# Patient Record
Sex: Female | Born: 1995 | Hispanic: No | Marital: Married | Smoking: Never smoker
Health system: Southern US, Community
[De-identification: ages and names within clinical notes are randomized; demographics above are authoritative.]

## PROBLEM LIST (undated history)

## (undated) ENCOUNTER — Inpatient Hospital Stay (HOSPITAL_COMMUNITY): Payer: Self-pay

## (undated) ENCOUNTER — Emergency Department (HOSPITAL_COMMUNITY): Discharge status: Absent without leave | Payer: Medicaid Other

## (undated) DIAGNOSIS — N83209 Unspecified ovarian cyst, unspecified side: Secondary | ICD-10-CM

## (undated) DIAGNOSIS — D649 Anemia, unspecified: Secondary | ICD-10-CM

---

## 2015-10-28 ENCOUNTER — Encounter: Payer: Self-pay | Admitting: *Deleted

## 2015-11-06 ENCOUNTER — Inpatient Hospital Stay (HOSPITAL_COMMUNITY)
Admission: AD | Admit: 2015-11-06 | Discharge: 2015-11-06 | Disposition: A | Discharge status: Home / Self Care | Payer: Medicaid Other | Source: Ambulatory Visit | Attending: Obstetrics and Gynecology | Admitting: Obstetrics and Gynecology

## 2015-11-06 ENCOUNTER — Encounter (HOSPITAL_COMMUNITY): Payer: Self-pay | Admitting: *Deleted

## 2015-11-06 DIAGNOSIS — Z3A33 33 weeks gestation of pregnancy: Secondary | ICD-10-CM | POA: Diagnosis not present

## 2015-11-06 DIAGNOSIS — O0933 Supervision of pregnancy with insufficient antenatal care, third trimester: Secondary | ICD-10-CM | POA: Insufficient documentation

## 2015-11-06 DIAGNOSIS — O212 Late vomiting of pregnancy: Secondary | ICD-10-CM | POA: Diagnosis not present

## 2015-11-06 DIAGNOSIS — O219 Vomiting of pregnancy, unspecified: Secondary | ICD-10-CM

## 2015-11-06 DIAGNOSIS — E860 Dehydration: Secondary | ICD-10-CM | POA: Diagnosis not present

## 2015-11-06 DIAGNOSIS — O26899 Other specified pregnancy related conditions, unspecified trimester: Secondary | ICD-10-CM

## 2015-11-06 DIAGNOSIS — R109 Unspecified abdominal pain: Secondary | ICD-10-CM | POA: Insufficient documentation

## 2015-11-06 DIAGNOSIS — O9989 Other specified diseases and conditions complicating pregnancy, childbirth and the puerperium: Secondary | ICD-10-CM | POA: Diagnosis not present

## 2015-11-06 HISTORY — DX: Unspecified ovarian cyst, unspecified side: N83.209

## 2015-11-06 HISTORY — DX: Anemia, unspecified: D64.9

## 2015-11-06 LAB — CBC
HCT: 31.1 % — ABNORMAL LOW (ref 36.0–46.0)
Hemoglobin: 10.8 g/dL — ABNORMAL LOW (ref 12.0–15.0)
MCH: 32.2 pg (ref 26.0–34.0)
MCHC: 34.7 g/dL (ref 30.0–36.0)
MCV: 92.8 fL (ref 78.0–100.0)
PLATELETS: 212 10*3/uL (ref 150–400)
RBC: 3.35 MIL/uL — AB (ref 3.87–5.11)
RDW: 13.1 % (ref 11.5–15.5)
WBC: 7.3 10*3/uL (ref 4.0–10.5)

## 2015-11-06 LAB — URINALYSIS, ROUTINE W REFLEX MICROSCOPIC
BILIRUBIN URINE: NEGATIVE
Glucose, UA: NEGATIVE mg/dL
Hgb urine dipstick: NEGATIVE
Ketones, ur: 15 mg/dL — AB
NITRITE: NEGATIVE
PH: 6 (ref 5.0–8.0)
Protein, ur: NEGATIVE mg/dL
SPECIFIC GRAVITY, URINE: 1.025 (ref 1.005–1.030)

## 2015-11-06 LAB — URINE MICROSCOPIC-ADD ON: RBC / HPF: NONE SEEN RBC/hpf (ref 0–5)

## 2015-11-06 MED ORDER — PROMETHAZINE HCL 25 MG PO TABS
25.0000 mg | ORAL_TABLET | Freq: Once | ORAL | Status: AC
  Administered 2015-11-06: 25 mg via ORAL
  Filled 2015-11-06: qty 1

## 2015-11-06 MED ORDER — PRENATAL VITAMINS 28-0.8 MG PO TABS: 1.0000 | ORAL_TABLET | Freq: Every day | ORAL | Status: DC

## 2015-11-06 MED ORDER — PROMETHAZINE HCL 25 MG PO TABS: 12.5000 mg | ORAL_TABLET | Freq: Four times a day (QID) | ORAL | Status: DC | PRN

## 2015-11-06 NOTE — Progress Notes (Signed)
Patient tolerating PO fluids. Drank full cup of water and full cup of apple juice. No adverse effects to med.

## 2015-11-06 NOTE — MAU Provider Note (Signed)
Chief Complaint:  Dizziness; Abdominal Pain; and Emesis   None     HPI: Claudia Lucas is a 19 y.o. G1P0 at [redacted]w[redacted]d who presents to maternity admissions reporting dizziness daily, intermittent abdominal cramping, mostly at night, and nausea/lack of appetite with occasional vomiting. She arrived in the [place] in  November and has not yet had prenatal care. She has an appointment on 12/15 in [facility] but is worried that her symptoms mean something is wrong with the baby. She reports good fetal movement, denies LOF, vaginal bleeding, vaginal itching/burning, urinary symptoms, h/a, dizziness, n/v, or fever/chills.    Dizziness This is a recurrent problem. The current episode started more than 1 month ago. The problem occurs constantly. The problem has been unchanged. Associated symptoms include abdominal pain, nausea, vomiting and weakness. Pertinent negatives include no chest pain, chills, fatigue, fever, headaches, neck pain or urinary symptoms. The symptoms are aggravated by standing and walking. Treatments tried: iron supplements. The treatment provided moderate relief.  Abdominal Pain This is a recurrent problem. The current episode started in the past 7 days. The onset quality is gradual. The problem occurs intermittently. The problem has been resolved. The pain is located in the generalized abdominal region. The pain is moderate. The quality of the pain is cramping. The abdominal pain does not radiate. Associated symptoms include nausea and vomiting. Pertinent negatives include no constipation, diarrhea, dysuria, fever, frequency, headaches or weight loss. Nothing aggravates the pain. The pain is relieved by nothing. Treatments tried: rest, warm shower. The treatment provided no relief.  Emesis  This is a recurrent problem. The current episode started 1 to 4 weeks ago. The problem occurs less than 2 times per day. The problem has been waxing and waning. The emesis has an appearance of stomach contents. There  has been no fever. Associated symptoms include abdominal pain and dizziness. Pertinent negatives include no chest pain, chills, diarrhea, fever, headaches or weight loss. She has tried nothing for the symptoms.    Past Medical History: Past Medical History  Diagnosis Date  . Anemia   . Ovarian cyst     Past obstetric history: OB History  Gravida Para Term Preterm AB SAB TAB Ectopic Multiple Living  1             # Outcome Date GA Lbr Len/2nd Weight Sex Delivery Anes PTL Lv  1 Current               Past Surgical History: Past Surgical History  Procedure Laterality Date  . No past surgeries      Family History: Family History  Problem Relation Age of Onset  . Diabetes Mother   . Hypertension Mother   . Hypertension Maternal Aunt   . Diabetes Maternal Aunt   . Hypertension Maternal Uncle   . Diabetes Maternal Uncle   . Hypertension Maternal Grandmother   . Diabetes Maternal Grandmother   . Hypertension Maternal Grandfather   . Diabetes Maternal Grandfather   . Cancer Neg Hx   . Heart disease Neg Hx   . Stroke Neg Hx     Social History: Social History  Substance Use Topics  . Smoking status: Never Smoker   . Smokeless tobacco: Never Used  . Alcohol Use: No    Allergies: No Known Allergies  Meds:  No prescriptions prior to admission    ROS:  Review of Systems  Constitutional: Negative for fever, chills, weight loss and fatigue.  HENT: Negative for sinus pressure.   Eyes: Negative  for photophobia.  Respiratory: Negative for shortness of breath.   Cardiovascular: Negative for chest pain.  Gastrointestinal: Positive for nausea, vomiting and abdominal pain. Negative for diarrhea and constipation.  Genitourinary: Negative for dysuria, frequency, flank pain, vaginal bleeding, vaginal discharge, difficulty urinating, vaginal pain and pelvic pain.  Musculoskeletal: Negative for neck pain.  Neurological: Positive for dizziness and weakness. Negative for  headaches.  Psychiatric/Behavioral: Negative.      I have reviewed patient's Past Medical Hx, Surgical Hx, Family Hx, Social Hx, medications and allergies.   Physical Exam  Patient Vitals for the past 24 hrs:  BP Temp Temp src Pulse Resp SpO2  11/06/15 1327 - - - 116 - 100 %  11/06/15 1318 118/62 mmHg 98.9 F (37.2 C) Oral 106 16 -   Constitutional: Well-developed, well-nourished female in no acute distress.  Cardiovascular: normal rate Respiratory: normal effort GI: Abd soft, non-tender, gravid appropriate for gestational age.  MS: Extremities nontender, no edema, normal ROM Neurologic: Alert and oriented x 4.  GU: Neg CVAT.   Dilation: Closed Effacement (%): Thick Cervical Position: Posterior Station: Ballotable Exam by:: Clayton LefortL. Kirby, CNM  FHT:  Baseline 150 , moderate variability, accelerations present, no decelerations Contractions: None on toco or to palpation   Labs: Results for orders placed or performed during the hospital encounter of 11/06/15 (from the past 24 hour(s))  Urinalysis, Routine w reflex microscopic (not [facility])     Status: Abnormal   Collection Time: 11/06/15  1:45 PM  Result Value Ref Range   Color, Urine YELLOW YELLOW   APPearance HAZY (A) CLEAR   Specific Gravity, Urine 1.025 1.005 - 1.030   pH 6.0 5.0 - 8.0   Glucose, UA NEGATIVE NEGATIVE mg/dL   Hgb urine dipstick NEGATIVE NEGATIVE   Bilirubin Urine NEGATIVE NEGATIVE   Ketones, ur 15 (A) NEGATIVE mg/dL   Protein, ur NEGATIVE NEGATIVE mg/dL   Nitrite NEGATIVE NEGATIVE   Leukocytes, UA MODERATE (A) NEGATIVE  Urine microscopic-add on     Status: Abnormal   Collection Time: 11/06/15  1:45 PM  Result Value Ref Range   Squamous Epithelial / LPF 0-5 (A) NONE SEEN   WBC, UA 0-5 0 - 5 WBC/hpf   RBC / HPF NONE SEEN 0 - 5 RBC/hpf   Bacteria, UA RARE (A) NONE SEEN      Imaging:  No results found.  MAU Course/MDM: I have ordered labs and reviewed results. No evidence of preterm labor or other  pregnancy complications today. Will treat n/v with Phenergan, and encourage PNV for iron intake.  No additional supplement needed based on today's labs but pt encouraged to eat iron rich diet.  Treatments in MAU included Phenergan 25 mg PO with improvement in nausea.  Outpatient US ordered and pt to continue appt as planned with [facility].  Pt stable at time of discharge.  Assessment:  1. Mild dehydration   2. Nausea and vomiting during pregnancy   3. Abdominal pain affecting pregnancy   4. Limited prenatal care in third trimester    Plan: Discharge home Labor precautions and fetal kick counts F/U with outpatient US Phenergan 25 mg PO Q 6 hours PRN Prenatal vitamin daily F/U with [facility] Return to MAU as needed for emergencies    Medication List    Notice    You have not been prescribed any medications.      Sharen CounterLisa Leftwich-Kirby Certified Nurse-Midwife 11/06/2015 2:35 PM

## 2015-11-06 NOTE — MAU Note (Addendum)
Is experiencing dizziness.  Having abd pain for the last 15 days.  Is vomiting and not eating. Not sleeping the entire night.  Extreme tiredness and headache. Is 8 months preg, thinks that something is wrong with the baby.  No prenatal care since arriving in the [place]. Has appt for next Tues..  Arrived Nov 1st. Has hx of anemia.

## 2015-11-06 NOTE — Discharge Instructions (Signed)
()   .          .                 Marland Kitchen                .                  ( ).      .                  .        :      .      .       :  . :     (       )          .       :   B6.   .   .                     .                 .                .        .     (  ).           .      .    .      Marland Kitchen                 .                 .      (  )     .     .     (  )   .     .   .           .      .    :         .     .   Marland Kitchen SEEK    :      .    ().  :   .   .             .                 .            .    : 01/07/2007  : 2013/11/21   : 2013/03/05      2016    Morning Sickness Morning sickness is when you feel sick to your stomach (nauseous) during pregnancy. This nauseous feeling may or may not come with vomiting. It often occurs in the morning but can be a problem any time of day. Morning sickness is most common during the first trimester, but it may continue throughout pregnancy. While morning sickness is unpleasant, it is usually harmless unless you develop severe and continual vomiting (hyperemesis gravidarum). This condition requires more intense treatment.  CAUSES  The cause of morning sickness is not completely known but seems to be related to normal hormonal changes that occur in pregnancy. RISK FACTORS You are at greater risk if you:  Experienced nausea or vomiting before your pregnancy.  Had morning sickness during a previous pregnancy.  Are pregnant with more than one baby, such as twins. TREATMENT  Do not use any medicines (prescription, over-the-counter, or herbal) for morning sickness without first talking to your health care provider. Your health care provider may prescribe or recommend:  Vitamin B6 supplements.  Anti-nausea medicines.  The herbal medicine ginger. HOME CARE INSTRUCTIONS   Only take  over-the-counter or prescription medicines as directed by your health care provider.  Taking multivitamins before getting pregnant can prevent or decrease the severity of morning sickness in most women.  Eat a piece of dry toast or unsalted crackers before getting out of bed in the morning.  Eat five or six small meals a day.  Eat dry and bland foods (rice, baked potato). Foods high in carbohydrates are often helpful.  Do not drink liquids with your meals. Drink liquids between meals.  Avoid greasy, fatty, and spicy foods.  Get someone to cook for you if the smell of any food causes nausea and vomiting.  If you feel nauseous after taking prenatal vitamins, take the vitamins at night or with a snack.  Snack on protein foods (nuts, yogurt, cheese) between meals if you are hungry.  Eat unsweetened gelatins for desserts.  Wearing an acupressure wristband (worn for sea sickness) may be helpful.  Acupuncture may be helpful.  Do not smoke.  Get a humidifier to keep the air in your house free of odors.  Get plenty of fresh air. SEEK MEDICAL CARE IF:   Your home remedies are not working, and you need medicine.  You feel dizzy or lightheaded.  You are losing weight. SEEK IMMEDIATE MEDICAL CARE IF:   You have persistent and uncontrolled nausea and vomiting.  You pass out (faint). MAKE SURE YOU:  Understand these instructions.  Will watch your condition.  Will get help right away if you are not doing well or get worse.   This information is not intended to replace advice given to you by your health care provider. Make sure you discuss any questions you have with your health care provider.   Document Released: 01/07/2007 Document Revised: 11/21/2013 Document Reviewed: 05/03/2013 Elsevier Interactive Patient Education [employer]2016 Elsevier Inc.

## 2015-11-13 ENCOUNTER — Ambulatory Visit (HOSPITAL_COMMUNITY)
Admit: 2015-11-13 | Discharge: 2015-11-13 | Disposition: A | Discharge status: Home / Self Care | Payer: Medicaid Other | Attending: Advanced Practice Midwife | Admitting: Advanced Practice Midwife

## 2015-11-13 ENCOUNTER — Other Ambulatory Visit (HOSPITAL_COMMUNITY): Payer: Self-pay | Admitting: Advanced Practice Midwife

## 2015-11-13 DIAGNOSIS — O0933 Supervision of pregnancy with insufficient antenatal care, third trimester: Secondary | ICD-10-CM

## 2015-11-13 DIAGNOSIS — Z3A34 34 weeks gestation of pregnancy: Secondary | ICD-10-CM | POA: Diagnosis not present

## 2015-11-13 DIAGNOSIS — Z3689 Encounter for other specified antenatal screening: Secondary | ICD-10-CM

## 2015-11-13 DIAGNOSIS — Z36 Encounter for antenatal screening of mother: Secondary | ICD-10-CM | POA: Insufficient documentation

## 2015-11-14 ENCOUNTER — Encounter: Payer: Self-pay | Admitting: Obstetrics & Gynecology

## 2015-11-14 ENCOUNTER — Other Ambulatory Visit (HOSPITAL_COMMUNITY)
Admission: RE | Admit: 2015-11-14 | Discharge: 2015-11-14 | Disposition: A | Discharge status: Home / Self Care | Payer: Medicaid Other | Source: Ambulatory Visit | Attending: Obstetrics & Gynecology | Admitting: Obstetrics & Gynecology

## 2015-11-14 ENCOUNTER — Ambulatory Visit (INDEPENDENT_AMBULATORY_CARE_PROVIDER_SITE_OTHER): Payer: Medicaid Other | Admitting: Obstetrics & Gynecology

## 2015-11-14 VITALS — BP 118/73 | HR 111 | Temp 97.8°F | Wt 144.7 lb

## 2015-11-14 DIAGNOSIS — Z3400 Encounter for supervision of normal first pregnancy, unspecified trimester: Secondary | ICD-10-CM | POA: Insufficient documentation

## 2015-11-14 DIAGNOSIS — Z3403 Encounter for supervision of normal first pregnancy, third trimester: Secondary | ICD-10-CM | POA: Diagnosis not present

## 2015-11-14 DIAGNOSIS — Z23 Encounter for immunization: Secondary | ICD-10-CM | POA: Diagnosis not present

## 2015-11-14 DIAGNOSIS — Z01419 Encounter for gynecological examination (general) (routine) without abnormal findings: Secondary | ICD-10-CM | POA: Insufficient documentation

## 2015-11-14 LAB — POCT URINALYSIS DIP (DEVICE)
BILIRUBIN URINE: NEGATIVE
GLUCOSE, UA: NEGATIVE mg/dL
Hgb urine dipstick: NEGATIVE
KETONES UR: NEGATIVE mg/dL
Nitrite: NEGATIVE
Protein, ur: NEGATIVE mg/dL
SPECIFIC GRAVITY, URINE: 1.015 (ref 1.005–1.030)
Urobilinogen, UA: 0.2 mg/dL (ref 0.0–1.0)
pH: 7 (ref 5.0–8.0)

## 2015-11-14 LAB — GLUCOSE TOLERANCE, 1 HOUR (50G) W/O FASTING: Glucose, 1 Hour GTT: 144 mg/dL — ABNORMAL HIGH (ref 70–140)

## 2015-11-14 MED ORDER — TETANUS-DIPHTH-ACELL PERTUSSIS 5-2.5-18.5 LF-MCG/0.5 IM SUSP
0.5000 mL | Freq: Once | INTRAMUSCULAR | Status: AC
  Administered 2015-11-14: 0.5 mL via INTRAMUSCULAR

## 2015-11-14 NOTE — Progress Notes (Signed)
Initial prenatal labs, 1hr gtt today Flu/Tdap today Breastfeeding tip of the week reviewed Arabic interpreter present

## 2015-11-14 NOTE — Patient Instructions (Signed)

## 2015-11-14 NOTE — Progress Notes (Signed)
   Subjective: new OB had care in [place]    Claudia Ok Edwardsbdo is a G1P0 8089w2d being seen today for her first obstetrical visit.  Her obstetrical history is significant for refugee from [place] laate registrant here. Patient does intend to breast feed. Pregnancy history fully reviewed.  Patient reports no complaints.  Filed Vitals:   11/14/15 0923  BP: 118/73  Pulse: 111  Temp: 97.8 F (36.6 C)  Weight: 144 lb 11.2 oz (65.635 kg)    HISTORY: OB History  Gravida Para Term Preterm AB SAB TAB Ectopic Multiple Living  1             # Outcome Date GA Lbr Len/2nd Weight Sex Delivery Anes PTL Lv  1 Current              Past Medical History  Diagnosis Date  . Anemia   . Ovarian cyst    Past Surgical History  Procedure Laterality Date  . No past surgeries     Family History  Problem Relation Age of Onset  . Diabetes Mother   . Hypertension Mother   . Hypertension Maternal Aunt   . Diabetes Maternal Aunt   . Hypertension Maternal Uncle   . Diabetes Maternal Uncle   . Hypertension Maternal Grandmother   . Diabetes Maternal Grandmother   . Hypertension Maternal Grandfather   . Diabetes Maternal Grandfather   . Cancer Neg Hx   . Heart disease Neg Hx   . Stroke Neg Hx      Exam    Uterus:     Pelvic Exam:    Perineum: No Hemorrhoids   Vulva: normal   Vagina:  normal mucosa   pH:    Cervix: no lesions   Adnexa: not evaluated   Bony Pelvis:    System: Breast:      Skin: normal coloration and turgor, no rashes    Neurologic: oriented, normal mood   Extremities: normal strength, tone, and muscle mass   HEENT extra ocular movement intact   Mouth/Teeth dental hygiene good   Neck supple   Cardiovascular: regular rate and rhythm   Respiratory:  appears well, vitals normal, no respiratory distress, acyanotic, normal RR   Abdomen: soft, non-tender; bowel sounds normal; no masses,  no organomegaly   Urinary: urethral meatus normal      Assessment:    Pregnancy: G1P0 Patient Active Problem List   Diagnosis Date Noted  . Supervision of low-risk first pregnancy 11/14/2015        Plan:     Initial labs drawn. Prenatal vitamins. Problem list reviewed and updated. Genetic Screening discussed no records but had nl US in [place]  Ultrasound discussed; fetal survey: results reviewed.11/13/15  Follow up in 2 weeks. 50% of 30 min visit spent on counseling and coordination of care.  Repeat prenatal labs and 1 hr GTT   ARNOLD,JAMES 11/14/2015

## 2015-11-15 LAB — PRENATAL PROFILE (SOLSTAS)
Antibody Screen: NEGATIVE
Basophils Absolute: 0 10*3/uL (ref 0.0–0.1)
Basophils Relative: 0 % (ref 0–1)
EOS PCT: 4 % (ref 0–5)
Eosinophils Absolute: 0.2 10*3/uL (ref 0.0–0.7)
HCT: 32.3 % — ABNORMAL LOW (ref 36.0–46.0)
HIV: NONREACTIVE
Hemoglobin: 11.1 g/dL — ABNORMAL LOW (ref 12.0–15.0)
Hepatitis B Surface Ag: NEGATIVE
LYMPHS ABS: 1.1 10*3/uL (ref 0.7–4.0)
Lymphocytes Relative: 18 % (ref 12–46)
MCH: 31.9 pg (ref 26.0–34.0)
MCHC: 34.4 g/dL (ref 30.0–36.0)
MCV: 92.8 fL (ref 78.0–100.0)
MONO ABS: 0.4 10*3/uL (ref 0.1–1.0)
MONOS PCT: 6 % (ref 3–12)
MPV: 11.3 fL (ref 8.6–12.4)
NEUTROS ABS: 4.3 10*3/uL (ref 1.7–7.7)
NEUTROS PCT: 72 % (ref 43–77)
PLATELETS: 238 10*3/uL (ref 150–400)
RBC: 3.48 MIL/uL — ABNORMAL LOW (ref 3.87–5.11)
RDW: 14 % (ref 11.5–15.5)
RH TYPE: POSITIVE
Rubella: 12.2 Index — ABNORMAL HIGH (ref <0.90)
WBC: 6 10*3/uL (ref 4.0–10.5)

## 2015-11-15 LAB — CYTOLOGY - PAP

## 2015-11-16 LAB — PRESCRIPTION MONITORING PROFILE (19 PANEL)
Amphetamine/Meth: NEGATIVE ng/mL
BARBITURATE SCREEN, URINE: NEGATIVE ng/mL
BENZODIAZEPINE SCREEN, URINE: NEGATIVE ng/mL
BUPRENORPHINE, URINE: NEGATIVE ng/mL
COCAINE METABOLITES: NEGATIVE ng/mL
CREATININE, URINE: 68.96 mg/dL (ref >20.0)
Cannabinoid Scrn, Ur: NEGATIVE ng/mL
Carisoprodol, Urine: NEGATIVE ng/mL
ECSTASY: NEGATIVE ng/mL
FENTANYL URINE: NEGATIVE ng/mL
METHAQUALONE SCREEN (URINE): NEGATIVE ng/mL
Meperidine, Ur: NEGATIVE ng/mL
Methadone Screen, Urine: NEGATIVE ng/mL
Nitrites, Initial: NEGATIVE ug/mL
OPIATE SCREEN, URINE: NEGATIVE ng/mL
Oxycodone Screen, Ur: NEGATIVE ng/mL
PH URINE, INITIAL: 7.1 pH (ref 4.5–8.9)
PHENCYCLIDINE, UR: NEGATIVE ng/mL
Propoxyphene: NEGATIVE ng/mL
Tapentadol, urine: NEGATIVE ng/mL
Tramadol Scrn, Ur: NEGATIVE ng/mL
ZOLPIDEM, URINE: NEGATIVE ng/mL

## 2015-11-17 LAB — CULTURE, OB URINE

## 2015-11-18 LAB — HEMOGLOBINOPATHY EVALUATION
Hemoglobin Other: 0 %
Hgb A2 Quant: 2.9 % (ref 2.2–3.2)
Hgb A: 97.1 % (ref 96.8–97.8)
Hgb F Quant: 0 % (ref 0.0–2.0)
Hgb S Quant: 0 %

## 2015-11-19 ENCOUNTER — Telehealth: Payer: Self-pay | Admitting: *Deleted

## 2015-11-19 NOTE — Telephone Encounter (Addendum)
Called Patient with [employer] Interpreter#[id number].  Informed her 1 hr glucose test was high, needs 3 hour GTT. Explained to her needs to come fasting and will be here about 3.5 hours.  She voiced understanding and agreed to 8am tomorrow.

## 2015-11-19 NOTE — Telephone Encounter (Signed)
Per message from Dr. Debroah LoopArnold needs 3 hour GTT.

## 2015-11-20 ENCOUNTER — Other Ambulatory Visit: Payer: Medicaid Other

## 2015-11-20 DIAGNOSIS — R7309 Other abnormal glucose: Secondary | ICD-10-CM

## 2015-11-21 LAB — GLUCOSE TOLERANCE, 3 HOURS
GLUCOSE, 1 HOUR-GESTATIONAL: 124 mg/dL (ref 70–189)
Glucose Tolerance, 2 hour: 96 mg/dL (ref 70–164)
Glucose Tolerance, Fasting: 74 mg/dL (ref 65–99)
Glucose, GTT - 3 Hour: 107 mg/dL (ref 70–144)

## 2015-11-28 ENCOUNTER — Encounter: Payer: Self-pay | Admitting: Family Medicine

## 2015-11-28 ENCOUNTER — Other Ambulatory Visit (HOSPITAL_COMMUNITY)
Admission: RE | Admit: 2015-11-28 | Discharge: 2015-11-28 | Disposition: A | Discharge status: Home / Self Care | Payer: Medicaid Other | Source: Ambulatory Visit | Attending: Family Medicine | Admitting: Family Medicine

## 2015-11-28 ENCOUNTER — Ambulatory Visit (INDEPENDENT_AMBULATORY_CARE_PROVIDER_SITE_OTHER): Payer: Medicaid Other | Admitting: Family Medicine

## 2015-11-28 VITALS — BP 121/78 | HR 113 | Temp 98.1°F | Wt 148.1 lb

## 2015-11-28 DIAGNOSIS — Z3403 Encounter for supervision of normal first pregnancy, third trimester: Secondary | ICD-10-CM

## 2015-11-28 DIAGNOSIS — Z113 Encounter for screening for infections with a predominantly sexual mode of transmission: Secondary | ICD-10-CM | POA: Insufficient documentation

## 2015-11-28 LAB — OB RESULTS CONSOLE GBS: STREP GROUP B AG: NEGATIVE

## 2015-11-28 NOTE — Progress Notes (Signed)
Arabic interpreter: Kenard GowerNuha Mohammad used Subjective:  Claudia Ok Edwardsbdo is a 19 y.o. G1P0 at 4771w2d being seen today for ongoing prenatal care.  She is currently monitored for the following issues for this low-risk pregnancy and has Supervision of low-risk first pregnancy on her problem list.  Patient reports no complaints.  Contractions: Irregular. Vag. Bleeding: None.  Movement: Present. Denies leaking of fluid.   The following portions of the patient's history were reviewed and updated as appropriate: allergies, current medications, past family history, past medical history, past social history, past surgical history and problem list. Problem list updated.  Objective:   Filed Vitals:   11/28/15 0923  Pulse: 113  Temp: 98.1 F (36.7 C)  Weight: 148 lb 1.6 oz (67.178 kg)    Fetal Status: Fetal Heart Rate (bpm): 155 Fundal Height: 36 cm Movement: Present  Presentation: Transverse  General:  Alert, oriented and cooperative. Patient is in no acute distress.  Skin: Skin is warm and dry. No rash noted.   Cardiovascular: Normal heart rate noted  Respiratory: Normal respiratory effort, no problems with respiration noted  Abdomen: Soft, gravid, appropriate for gestational age. Pain/Pressure: Present     Pelvic: Vag. Bleeding: None     Cervical exam performed Dilation: Fingertip Effacement (%): 20 Station: Ballotable  Extremities: Normal range of motion.  Edema: None  Mental Status: Normal mood and affect. Normal behavior. Normal judgment and thought content.   Urinalysis:      Assessment and Plan:  Pregnancy: G1P0 at 7871w2d  1. Encounter for supervision of normal first pregnancy in third trimester If remains transverse lie--consider offering version next week - Urine cytology ancillary only - Culture, beta strep (group b only)  Preterm labor symptoms and general obstetric precautions including but not limited to vaginal bleeding, contractions, leaking of fluid and fetal movement were  reviewed in detail with the patient. Please refer to After Visit Summary for other counseling recommendations.  Return [facility] 1 week (on 12/05/2015[facility]   Reva Boresanya S Pratt, MD

## 2015-11-28 NOTE — Patient Instructions (Addendum)
        29   42  7  9.        .    ڡ   20     6-10 .           .      .     .      25-35   (11-16 )   .           .               .        .                 .        ().                .            .      .      ߡ  ڡ   .                .           .       .         .     .          .    ""     .      .           .      ()   .               2    36.  ߡ      .     :          .     .      .      .        .                .   36 ǡ      .              .          ǡ   ɡ  .         .    :     .   .      .       ɡ    ݡ  ϡ    .       ۡ      ۡ  .     .              :        ( ).       ء  .               .     (   ) .     ѡ            .               .    ȡ   .              .      ɡ ݡ   ɡ           .     .    5    .               .          .          .            37         .          .       ȡ    .        .     ۡ      ۡ   .              .            .        .             .       .          .     ɡ  .        .        

## 2015-11-30 LAB — URINE CYTOLOGY ANCILLARY ONLY
Chlamydia: NEGATIVE
Neisseria Gonorrhea: NEGATIVE

## 2015-12-01 LAB — CULTURE, BETA STREP (GROUP B ONLY)

## 2015-12-05 ENCOUNTER — Telehealth (HOSPITAL_COMMUNITY): Payer: Self-pay | Admitting: *Deleted

## 2015-12-05 ENCOUNTER — Ambulatory Visit (INDEPENDENT_AMBULATORY_CARE_PROVIDER_SITE_OTHER): Payer: Medicaid Other | Admitting: Family Medicine

## 2015-12-05 ENCOUNTER — Encounter (HOSPITAL_COMMUNITY): Payer: Self-pay | Admitting: *Deleted

## 2015-12-05 VITALS — BP 110/55 | HR 101 | Temp 98.4°F | Wt 146.4 lb

## 2015-12-05 DIAGNOSIS — Z3403 Encounter for supervision of normal first pregnancy, third trimester: Secondary | ICD-10-CM

## 2015-12-05 NOTE — Patient Instructions (Signed)
External Cephalic Version External cephalic version is turning a baby that is presenting his or her buttocks first (breech) or is lying sideways in the uterus (transverse) to a head-first position. This makes the labor and delivery faster, safer for the mother and baby, and lessens the chance for a cesarean section. It should not be tried until the pregnancy is [redacted] weeks along or longer. BEFORE THE PROCEDURE   Do not take aspirin.  Do not eat for 4 hours before the procedure.  Tell your caregiver if you have a cold, fever, or an infection.  Tell your caregiver if you are having contractions.  Tell your caregiver if you are leaking or had a gush of fluid from your vagina.  Tell your caregiver if you have any vaginal bleeding or abnormal discharge.  If you are being admitted the same day, arrive at the hospital at least one hour before the procedure to sign any necessary documents and to get prepared for the procedure.  Tell your caregiver if you had any problems with anesthetics in the past.  Tell your caregiver if you are taking any medications that your caregiver does not know about. This includes over-the-counter and prescription drugs, herbs, eye drops and creams. PROCEDURE  First, an ultrasound is done to make sure the baby is breech or transverse.  A non-stress test or biophysical profile is done on the baby before the ECV. This is done to make sure it is safe for the baby to have the ECV. It may also be done after the procedure to make sure the baby is okay.  ECV is done in the delivery/surgical room with an anesthesiologist present. There should be a setup for an emergency cesarean section with a full nursing and nursery staff available and ready.  The patient may be given a medication to relax the uterine muscles. An epidural may be given for any discomfort. It is helpful for the success of the ECV.  An electronic fetal monitor is placed on the uterus during the procedure to  make sure the baby is okay.  If the mother is Rh-negative, Rho (D) immune globulin will be given to her to prevent Rh problems for future pregnancies.  The mother is followed closely for 2 to 3 hours after the procedure to make sure no problems develop. BENEFITS OF ECV  Easier and safer labor and delivery for the mother and baby.  Lower incidence of cesarean section.  Lower costs with a vaginal delivery. RISKS OF ECV  The placenta pulls away from the wall of the uterus before delivery (abruption of the placenta).  Rupture of the uterus, especially in patients with a previous cesarean section.  Fetal distress.  Early (premature) labor.  Premature rupture of the membranes.  The baby will return to the breech or transverse lie position.  Death of the fetus can happen but is very rare. ECV SHOULD BE STOPPED IF:  The fetal heart tones drop.  The mother is having a lot of pain.  You cannot turn the baby after several attempts. ECV SHOULD NOT BE DONE IF:  The non-stress test or biophysical profile is abnormal.  There is vaginal bleeding.  An abnormal shaped uterus is present.  There is heart disease or uncontrolled high blood pressure in the mother.  There are twins or more.  The placenta covers the opening of the cervix (placenta previa).  You had a previous cesarean section with a classical incision or major surgery of the uterus.  There   is not enough amniotic fluid in the sac (oligohydramnios). °· The baby is too small for the pregnancy or has not developed normally (anomaly). °· Your membranes have ruptured. °HOME CARE INSTRUCTIONS  °· Have someone take you home after the procedure. °· Rest at home for several hours. °· Have someone stay with you for a few hours after you get home. °· After ECV, continue with your prenatal visits as directed. °· Continue your regular diet, rest and activities. °· Do not do any strenuous activities for a couple of days. °SEEK IMMEDIATE  MEDICAL CARE IF:  °· You develop vaginal bleeding. °· You have fluid coming out of your vagina (bag of water may have broken). °· You develop uterine contractions. °· You do not feel the baby move or there is less movement of the baby. °· You develop abdominal pain. °· You develop an oral temperature of 102° F (38.9° C) or higher. °  °This information is not intended to replace advice given to you by your health care provider. Make sure you discuss any questions you have with your health care provider. °  °Document Released: 05/11/2007 Document Revised: 12/07/2014 Document Reviewed: 03/06/2009 °Elsevier Interactive Patient Education ©2016 Elsevier Inc. ° °

## 2015-12-05 NOTE — Telephone Encounter (Signed)
Preadmission screen Interpreter number [id number] 

## 2015-12-05 NOTE — Progress Notes (Signed)
Version 12/06/15 @ 8a.  Pt aware to arrive @ 7a and nothing to eat/drink after midnight.  Arabic interpreter [employer]an Elkonti present.

## 2015-12-05 NOTE — Progress Notes (Signed)
Subjective:  Claudia Lucas is a 20 y.o. G1P0 at [redacted]w[redacted]d being seen today for ongoing prenatal care.  She is currently monitored for the following issues for this low-risk pregnancy and has Supervision of low-risk first pregnancy on her problem list.  Patient reports no complaints.  Contractions: Not present. Vag. Bleeding: None.  Movement: Present. Denies leaking of fluid.   The following portions of the patient's history were reviewed and updated as appropriate: allergies, current medications, past family history, past medical history, past social history, past surgical history and problem list. Problem list updated.  Objective:   Filed Vitals:   12/05/15 1054  BP: 110/55  Pulse: 101  Temp: 98.4 F (36.9 C)  Weight: 146 lb 6.4 oz (66.407 kg)    Fetal Status: Fetal Heart Rate (bpm): 134   Movement: Present     General:  Alert, oriented and cooperative. Patient is in no acute distress.  Skin: Skin is warm and dry. No rash noted.   Cardiovascular: Normal heart rate noted  Respiratory: Normal respiratory effort, no problems with respiration noted  Abdomen: Soft, gravid, appropriate for gestational age. Pain/Pressure: Absent     Pelvic: Vag. Bleeding: None     Cervical exam deferred        Extremities: Normal range of motion.  Edema: None  Mental Status: Normal mood and affect. Normal behavior. Normal judgment and thought content.   Urinalysis:      Assessment and Plan:  Pregnancy: G1P0 at [redacted]w[redacted]d  1. Encounter for supervision of normal first pregnancy in third trimester US today shows baby - breech.  Discussed ECV vs PLTCS.  Will schedule ECV.  Term labor symptoms and general obstetric precautions including but not limited to vaginal bleeding, contractions, leaking of fluid and fetal movement were reviewed in detail with the patient. Please refer to After Visit Summary for other counseling recommendations.  No Follow-up on file.   Levie HeritageJacob J Sita Mangen, DO

## 2015-12-05 NOTE — Progress Notes (Signed)
Breastfeeding tip of the week reviewed Interpreter present 

## 2015-12-06 ENCOUNTER — Encounter (HOSPITAL_COMMUNITY): Payer: Self-pay

## 2015-12-06 ENCOUNTER — Inpatient Hospital Stay (HOSPITAL_COMMUNITY)
Admission: RE | Admit: 2015-12-06 | Discharge: 2015-12-06 | DRG: 782 | Disposition: A | Discharge status: Home / Self Care | Payer: Medicaid Other | Source: Ambulatory Visit | Attending: Obstetrics and Gynecology | Admitting: Obstetrics and Gynecology

## 2015-12-06 DIAGNOSIS — Z3A37 37 weeks gestation of pregnancy: Secondary | ICD-10-CM

## 2015-12-06 DIAGNOSIS — O321XX0 Maternal care for breech presentation, not applicable or unspecified: Secondary | ICD-10-CM | POA: Diagnosis present

## 2015-12-06 MED ORDER — LIDOCAINE HCL (PF) 1 % IJ SOLN: 30.0000 mL | INTRAMUSCULAR | Status: DC | PRN

## 2015-12-06 MED ORDER — CITRIC ACID-SODIUM CITRATE 334-500 MG/5ML PO SOLN: 30.0000 mL | ORAL | Status: DC | PRN

## 2015-12-06 MED ORDER — ACETAMINOPHEN 325 MG PO TABS: 650.0000 mg | ORAL_TABLET | ORAL | Status: DC | PRN

## 2015-12-06 MED ORDER — OXYTOCIN BOLUS FROM INFUSION: 500.0000 mL | INTRAVENOUS | Status: DC

## 2015-12-06 MED ORDER — TERBUTALINE SULFATE 1 MG/ML IJ SOLN: 0.2500 mg | Freq: Once | INTRAMUSCULAR | Status: DC

## 2015-12-06 MED ORDER — TERBUTALINE SULFATE 1 MG/ML IJ SOLN
INTRAMUSCULAR | Status: AC
  Administered 2015-12-06: 0.25 mg via SUBCUTANEOUS
  Filled 2015-12-06: qty 1

## 2015-12-06 MED ORDER — LACTATED RINGERS IV SOLN
INTRAVENOUS | Status: DC
Start: 2015-12-06 — End: 2015-12-06

## 2015-12-06 MED ORDER — OXYTOCIN 10 UNIT/ML IJ SOLN: 2.5000 [IU]/h | INTRAVENOUS | Status: DC

## 2015-12-06 MED ORDER — ONDANSETRON HCL 4 MG/2ML IJ SOLN: 4.0000 mg | Freq: Four times a day (QID) | INTRAMUSCULAR | Status: DC | PRN

## 2015-12-06 MED ORDER — LACTATED RINGERS IV SOLN: 500.0000 mL | INTRAVENOUS | Status: DC | PRN

## 2015-12-06 MED ORDER — TERBUTALINE SULFATE 1 MG/ML IJ SOLN
0.2500 mg | Freq: Once | INTRAMUSCULAR | Status: AC
  Administered 2015-12-06: 0.25 mg via SUBCUTANEOUS

## 2015-12-06 NOTE — H&P (Signed)
  Claudia Lucas is an 20 y.o. G1P0 2659w3d female.   Chief Complaint: breech HPI: patient is 37 weeks and breech for ECV.  Past Medical History  Diagnosis Date  . Anemia   . Ovarian cyst     Past Surgical History  Procedure Laterality Date  . No past surgeries      Family History  Problem Relation Age of Onset  . Diabetes Mother   . Hypertension Mother   . Hypertension Maternal Aunt   . Diabetes Maternal Aunt   . Hypertension Maternal Uncle   . Diabetes Maternal Uncle   . Hypertension Maternal Grandmother   . Diabetes Maternal Grandmother   . Hypertension Maternal Grandfather   . Diabetes Maternal Grandfather   . Cancer Neg Hx   . Heart disease Neg Hx   . Stroke Neg Hx    Social History:  reports that she has never smoked. She has never used smokeless tobacco. She reports that she does not drink alcohol or use illicit drugs.   No Known Allergies  No current facility-administered medications on file prior to encounter.   Current Outpatient Prescriptions on File Prior to Encounter  Medication Sig Dispense Refill  . Prenatal Vit-Fe Fumarate-FA (PRENATAL VITAMINS) 28-0.8 MG TABS Take 1 tablet by mouth daily. 30 tablet 5  . promethazine (PHENERGAN) 25 MG tablet Take 0.5-1 tablets (12.5-25 mg total) by mouth every 6 (six) hours as needed for nausea. 30 tablet 0    A comprehensive review of systems was negative.  Last menstrual period 03/15/2015. LMP 03/15/2015 (Exact Date) General appearance: alert, cooperative and appears stated age Head: Normocephalic, without obvious abnormality, atraumatic Neck: supple, symmetrical, trachea midline Lungs: normal effort Abdomen: gravid, NT Extremities: extremities normal, atraumatic, no cyanosis or edema Skin: Skin color, texture, turgor normal. No rashes or lesions Neurologic: Grossly normal   Lab Results  Component Value Date   WBC 6.0 11/14/2015   HGB 11.1* 11/14/2015   HCT 32.3* 11/14/2015   MCV 92.8 11/14/2015   PLT 238  11/14/2015         ABO, Rh: B/POS/-- (12/15 1017)  Antibody: NEG (12/15 1017)  Rubella: !Error!  RPR: NON REAC (12/15 1017)  HBsAg: NEGATIVE (12/15 1017)  HIV: NONREACTIVE (12/15 1017)  GBS: Negative (12/29 0000)     Assessment/Plan Patient Active Problem List   Diagnosis Date Noted  . Breech presentation, antepartum 12/06/2015  . Supervision of low-risk first pregnancy 11/14/2015   For ECV--risks/beneifts discussed.  Ahsan Esterline S 12/06/2015, 8:12 AM

## 2015-12-06 NOTE — Discharge Instructions (Signed)
External Cephalic Version External cephalic version is turning a baby that is presenting his or her buttocks first (breech) or is lying sideways in the uterus (transverse) to a head-first position. This makes the labor and delivery faster, safer for the mother and baby, and lessens the chance for a cesarean section. It should not be tried until the pregnancy is [redacted] weeks along or longer. BEFORE THE PROCEDURE   Do not take aspirin.  Do not eat for 4 hours before the procedure.  Tell your caregiver if you have a cold, fever, or an infection.  Tell your caregiver if you are having contractions.  Tell your caregiver if you are leaking or had a gush of fluid from your vagina.  Tell your caregiver if you have any vaginal bleeding or abnormal discharge.  If you are being admitted the same day, arrive at the hospital at least one hour before the procedure to sign any necessary documents and to get prepared for the procedure.  Tell your caregiver if you had any problems with anesthetics in the past.  Tell your caregiver if you are taking any medications that your caregiver does not know about. This includes over-the-counter and prescription drugs, herbs, eye drops and creams. PROCEDURE  First, an ultrasound is done to make sure the baby is breech or transverse.  A non-stress test or biophysical profile is done on the baby before the ECV. This is done to make sure it is safe for the baby to have the ECV. It may also be done after the procedure to make sure the baby is okay.  ECV is done in the delivery/surgical room with an anesthesiologist present. There should be a setup for an emergency cesarean section with a full nursing and nursery staff available and ready.  The patient may be given a medication to relax the uterine muscles. An epidural may be given for any discomfort. It is helpful for the success of the ECV.  An electronic fetal monitor is placed on the uterus during the procedure to  make sure the baby is okay.  If the mother is Rh-negative, Rho (D) immune globulin will be given to her to prevent Rh problems for future pregnancies.  The mother is followed closely for 2 to 3 hours after the procedure to make sure no problems develop. BENEFITS OF ECV  Easier and safer labor and delivery for the mother and baby.  Lower incidence of cesarean section.  Lower costs with a vaginal delivery. RISKS OF ECV  The placenta pulls away from the wall of the uterus before delivery (abruption of the placenta).  Rupture of the uterus, especially in patients with a previous cesarean section.  Fetal distress.  Early (premature) labor.  Premature rupture of the membranes.  The baby will return to the breech or transverse lie position.  Death of the fetus can happen but is very rare. ECV SHOULD BE STOPPED IF:  The fetal heart tones drop.  The mother is having a lot of pain.  You cannot turn the baby after several attempts. ECV SHOULD NOT BE DONE IF:  The non-stress test or biophysical profile is abnormal.  There is vaginal bleeding.  An abnormal shaped uterus is present.  There is heart disease or uncontrolled high blood pressure in the mother.  There are twins or more.  The placenta covers the opening of the cervix (placenta previa).  You had a previous cesarean section with a classical incision or major surgery of the uterus.  There   is not enough amniotic fluid in the sac (oligohydramnios). °· The baby is too small for the pregnancy or has not developed normally (anomaly). °· Your membranes have ruptured. °HOME CARE INSTRUCTIONS  °· Have someone take you home after the procedure. °· Rest at home for several hours. °· Have someone stay with you for a few hours after you get home. °· After ECV, continue with your prenatal visits as directed. °· Continue your regular diet, rest and activities. °· Do not do any strenuous activities for a couple of days. °SEEK IMMEDIATE  MEDICAL CARE IF:  °· You develop vaginal bleeding. °· You have fluid coming out of your vagina (bag of water may have broken). °· You develop uterine contractions. °· You do not feel the baby move or there is less movement of the baby. °· You develop abdominal pain. °· You develop an oral temperature of 102° F (38.9° C) or higher. °  °This information is not intended to replace advice given to you by your health care provider. Make sure you discuss any questions you have with your health care provider. °  °Document Released: 05/11/2007 Document Revised: 12/07/2014 Document Reviewed: 03/06/2009 °Elsevier Interactive Patient Education ©2016 [employer] ° °

## 2015-12-12 ENCOUNTER — Encounter: Payer: Self-pay | Admitting: Family Medicine

## 2015-12-12 ENCOUNTER — Ambulatory Visit (INDEPENDENT_AMBULATORY_CARE_PROVIDER_SITE_OTHER): Payer: Medicaid Other | Admitting: Family Medicine

## 2015-12-12 VITALS — BP 108/66 | HR 96 | Temp 98.8°F | Wt 148.0 lb

## 2015-12-12 DIAGNOSIS — O321XX0 Maternal care for breech presentation, not applicable or unspecified: Secondary | ICD-10-CM | POA: Diagnosis not present

## 2015-12-12 DIAGNOSIS — Z3403 Encounter for supervision of normal first pregnancy, third trimester: Secondary | ICD-10-CM

## 2015-12-12 LAB — POCT URINALYSIS DIP (DEVICE)
Bilirubin Urine: NEGATIVE
GLUCOSE, UA: NEGATIVE mg/dL
Ketones, ur: NEGATIVE mg/dL
NITRITE: NEGATIVE
PROTEIN: NEGATIVE mg/dL
SPECIFIC GRAVITY, URINE: 1.02 (ref 1.005–1.030)
UROBILINOGEN UA: 0.2 mg/dL (ref 0.0–1.0)
pH: 6.5 (ref 5.0–8.0)

## 2015-12-12 NOTE — Progress Notes (Signed)
Nuha used for interpreter Large leuks on UA  Reviewed tip of week with patient

## 2015-12-12 NOTE — Progress Notes (Signed)
Subjective:  Claudia Lucas is a 20 y.o. G1P0 at 3786w2d being seen today for ongoing prenatal care.  She is currently monitored for the following issues for this low-risk pregnancy and has Supervision of low-risk first pregnancy and Breech malpresentation successfully converted to cephalic presentation on her problem list.  Patient reports no complaints.  Contractions: Irritability. Vag. Bleeding: None.  Movement: Present. Denies leaking of fluid.   The following portions of the patient's history were reviewed and updated as appropriate: allergies, current medications, past family history, past medical history, past social history, past surgical history and problem list. Problem list updated.  Objective:   Filed Vitals:   12/12/15 1104  BP: 108/66  Pulse: 96  Temp: 98.8 F (37.1 C)  Weight: 148 lb (67.132 kg)    Fetal Status: Fetal Heart Rate (bpm): 144 Fundal Height: 35 cm Movement: Present  Presentation: Vertex  General:  Alert, oriented and cooperative. Patient is in no acute distress.  Skin: Skin is warm and dry. No rash noted.   Cardiovascular: Normal heart rate noted  Respiratory: Normal respiratory effort, no problems with respiration noted  Abdomen: Soft, gravid, appropriate for gestational age. Pain/Pressure: Absent     Pelvic: Vag. Bleeding: None     Cervical exam deferred        Extremities: Normal range of motion.  Edema: None  Mental Status: Normal mood and affect. Normal behavior. Normal judgment and thought content.   Urinalysis: Urine Protein: Negative Urine Glucose: Negative  Assessment and Plan:  Pregnancy: G1P0 at 7486w2d  1. Encounter for supervision of normal first pregnancy in third trimester Continue routine prenatal care. Please give contraception options in arabic if possible, next visit.  2. Breech malpresentation successfully converted to cephalic presentation, not applicable or unspecified fetus Still vertex today  Term labor symptoms and general  obstetric precautions including but not limited to vaginal bleeding, contractions, leaking of fluid and fetal movement were reviewed in detail with the patient. Please refer to After Visit Summary for other counseling recommendations.  [facility]19/2015[facility]   Reva Boresanya S Livvy Spilman, MD

## 2015-12-12 NOTE — Patient Instructions (Addendum)
480 $.          .                -------------------------------------------------- ----------------------------   [phone] 480 $  4      [phone] 244 $  4      [phone] 175 $  2     [phone] 250 $  7  [facility] [phone] 150 $  4    Having a circumcision done in the hospital costs approximately $480.  This will have to be paid in full prior to circumcision being performed.  There are places to have circumcision done as an outpatient which are cheaper.    Circumcisions      Provider   Phone    Price     ------------------------------------------------------------------------------   [facility]  [phone]  $480 by 4 wks     [facility]   [phone]  $244 by 4 wks     [facility]   [phone]  $175 by 2 wks    [facility]   [phone]  $250 by 7 days [facility]   [phone]  $150 by 4 wks            29   42  7  9.        .       20     6-10 .           .      .     .      25-35   (11-16 )   .           .               .        Marland Kitchen.                 .        ().                Marland Kitchen.             .      .           .                .           .       .         .     .          .    ""     .      .           .      ()   .               2    36.        .     :          .     .      .      .        .               .   36       .              .               .         .    :     .   .      .                 .               .     .              :        ( ).         .               .     (    ) .                 .               .       Marland Kitchen.              Marland Kitchen.                     Marland Kitchen.     .Marland Kitchen  5    .               .          .          .          37         .          .       ȡ    .        .     ۡ      

## 2015-12-18 ENCOUNTER — Ambulatory Visit (INDEPENDENT_AMBULATORY_CARE_PROVIDER_SITE_OTHER): Payer: Medicaid Other | Admitting: Certified Nurse Midwife

## 2015-12-18 VITALS — BP 115/70 | HR 94 | Temp 98.7°F | Wt 149.1 lb

## 2015-12-18 DIAGNOSIS — Z3403 Encounter for supervision of normal first pregnancy, third trimester: Secondary | ICD-10-CM

## 2015-12-18 LAB — POCT URINALYSIS DIP (DEVICE)
Bilirubin Urine: NEGATIVE
Glucose, UA: NEGATIVE mg/dL
Hgb urine dipstick: NEGATIVE
Ketones, ur: NEGATIVE mg/dL
Nitrite: NEGATIVE
Protein, ur: NEGATIVE mg/dL
Specific Gravity, Urine: 1.02 (ref 1.005–1.030)
Urobilinogen, UA: 0.2 mg/dL (ref 0.0–1.0)
pH: 6.5 (ref 5.0–8.0)

## 2015-12-18 NOTE — Progress Notes (Signed)
Subjective:  Claudia Lucas is a 20 y.o. G1P0 at [redacted]w[redacted]d being seen today for ongoing prenatal care.  She is currently monitored for the following issues for this low-risk pregnancy and has Supervision of low-risk first pregnancy and Breech malpresentation successfully converted to cephalic presentation on her problem list.  Patient reports no complaints.  Contractions: Irritability. Vag. Bleeding: None.  Movement: Present. Denies leaking of fluid.   The following portions of the patient's history were reviewed and updated as appropriate: allergies, current medications, past family history, past medical history, past social history, past surgical history and problem list. Problem list updated.  Objective:   Filed Vitals:   12/18/15 0859  BP: 115/70  Pulse: 94  Temp: 98.7 F (37.1 C)  Weight: 149 lb 1.6 oz (67.631 kg)    Fetal Status: Fetal Heart Rate (bpm): 130   Movement: Present     General:  Alert, oriented and cooperative. Patient is in no acute distress.  Skin: Skin is warm and dry. No rash noted.   Cardiovascular: Normal heart rate noted  Respiratory: Normal respiratory effort, no problems with respiration noted  Abdomen: Soft, gravid, appropriate for gestational age. Pain/Pressure: Absent     Pelvic: Vag. Bleeding: None     Cervical exam deferred        Extremities: Normal range of motion.  Edema: None  Mental Status: Normal mood and affect. Normal behavior. Normal judgment and thought content.   Urinalysis: Urine Protein: Negative Urine Glucose: Negative  Assessment and Plan:  Pregnancy: G1P0 at [redacted]w[redacted]d  There are no diagnoses linked to this encounter. Term labor symptoms and general obstetric precautions including but not limited to vaginal bleeding, contractions, leaking of fluid and fetal movement were reviewed in detail with the patient. Please refer to After Visit Summary for other counseling recommendations.  Return in about 1 week (around 12/25/2015) for schedule  nst.   Rhea Pink, CNM

## 2015-12-18 NOTE — Progress Notes (Signed)
Breastfeeding tip of the week reviewed. 

## 2015-12-18 NOTE — Patient Instructions (Signed)

## 2015-12-25 ENCOUNTER — Ambulatory Visit (INDEPENDENT_AMBULATORY_CARE_PROVIDER_SITE_OTHER): Payer: Medicaid Other | Admitting: Certified Nurse Midwife

## 2015-12-25 VITALS — BP 103/64 | HR 85 | Wt 151.0 lb

## 2015-12-25 DIAGNOSIS — O321XX0 Maternal care for breech presentation, not applicable or unspecified: Secondary | ICD-10-CM

## 2015-12-25 DIAGNOSIS — Z3403 Encounter for supervision of normal first pregnancy, third trimester: Secondary | ICD-10-CM

## 2015-12-25 DIAGNOSIS — O480 Post-term pregnancy: Secondary | ICD-10-CM

## 2015-12-25 DIAGNOSIS — O321XX1 Maternal care for breech presentation, fetus 1: Secondary | ICD-10-CM

## 2015-12-25 LAB — POCT URINALYSIS DIP (DEVICE)
Bilirubin Urine: NEGATIVE
GLUCOSE, UA: NEGATIVE mg/dL
Hgb urine dipstick: NEGATIVE
Ketones, ur: NEGATIVE mg/dL
Nitrite: NEGATIVE
Protein, ur: NEGATIVE mg/dL
SPECIFIC GRAVITY, URINE: 1.025 (ref 1.005–1.030)
UROBILINOGEN UA: 0.2 mg/dL (ref 0.0–1.0)
pH: 6.5 (ref 5.0–8.0)

## 2015-12-25 NOTE — Progress Notes (Signed)
Subjective:  Claudia Lucas is a 20 y.o. G1P0 at [redacted]w[redacted]d being seen today for ongoing prenatal care.  She is currently monitored for the following issues for this low-risk pregnancy and has Supervision of low-risk first pregnancy and Breech malpresentation successfully converted to cephalic presentation on her problem list.  Patient reports no complaints.  Contractions: Not present. Vag. Bleeding: None.  Movement: Present. Denies leaking of fluid.   The following portions of the patient's history were reviewed and updated as appropriate: allergies, current medications, past family history, past medical history, past social history, past surgical history and problem list. Problem list updated.  Objective:   Filed Vitals:   12/25/15 1010  BP: 103/64  Pulse: 85  Weight: 151 lb (68.493 kg)    Fetal Status:     Movement: Present     General:  Alert, oriented and cooperative. Patient is in no acute distress.  Skin: Skin is warm and dry. No rash noted.   Cardiovascular: Normal heart rate noted  Respiratory: Normal respiratory effort, no problems with respiration noted  Abdomen: Soft, gravid, appropriate for gestational age. Pain/Pressure: Absent     Pelvic: Vag. Bleeding: None     Cervical exam deferred        Extremities: Normal range of motion.  Edema: None  Mental Status: Normal mood and affect. Normal behavior. Normal judgment and thought content.   Urinalysis: Urine Protein: Negative Urine Glucose: Negative  Assessment and Plan:  Pregnancy: G1P0 at [redacted]w[redacted]d  1. Encounter for supervision of normal first pregnancy in third trimester  - Fetal nonstress test reactive 2. Post-term pregnancy, 40-42 weeks of gestation  - Fetal nonstress test  3. Breech malpresentation successfully converted to cephalic presentation, fetus 1   Term labor symptoms and general obstetric precautions including but not limited to vaginal bleeding, contractions, leaking of fluid and fetal movement were  reviewed in detail with the patient. Please refer to After Visit Summary for other counseling recommendations.  Return in about 2 days (around 12/27/2015) for NST/AFI.   Rhea Pink, CNM

## 2015-12-25 NOTE — Patient Instructions (Signed)

## 2015-12-25 NOTE — Progress Notes (Signed)
Feyrel used for interpreter

## 2015-12-27 ENCOUNTER — Telehealth (HOSPITAL_COMMUNITY): Payer: Self-pay | Admitting: *Deleted

## 2015-12-27 ENCOUNTER — Ambulatory Visit (INDEPENDENT_AMBULATORY_CARE_PROVIDER_SITE_OTHER): Payer: Medicaid Other | Admitting: *Deleted

## 2015-12-27 VITALS — BP 108/73 | HR 98

## 2015-12-27 DIAGNOSIS — O480 Post-term pregnancy: Secondary | ICD-10-CM

## 2015-12-27 DIAGNOSIS — Z36 Encounter for antenatal screening of mother: Secondary | ICD-10-CM

## 2015-12-27 NOTE — Telephone Encounter (Signed)
Preadmission screen  

## 2015-12-27 NOTE — Progress Notes (Signed)
Interpreter Azucena Cecil present for encounter.  IOL scheduled 2/1 @ 0700.

## 2015-12-28 ENCOUNTER — Inpatient Hospital Stay (HOSPITAL_COMMUNITY)
Admission: AD | Admit: 2015-12-28 | Discharge: 2015-12-28 | Disposition: A | Discharge status: Home / Self Care | Payer: Medicaid Other | Source: Ambulatory Visit | Attending: Family Medicine | Admitting: Family Medicine

## 2015-12-28 ENCOUNTER — Encounter (HOSPITAL_COMMUNITY): Payer: Self-pay

## 2015-12-28 ENCOUNTER — Inpatient Hospital Stay (HOSPITAL_COMMUNITY): Payer: Medicaid Other | Admitting: Anesthesiology

## 2015-12-28 ENCOUNTER — Encounter (HOSPITAL_COMMUNITY): Payer: Self-pay | Admitting: *Deleted

## 2015-12-28 ENCOUNTER — Inpatient Hospital Stay (HOSPITAL_COMMUNITY): Payer: Medicaid Other

## 2015-12-28 ENCOUNTER — Inpatient Hospital Stay (HOSPITAL_COMMUNITY)
Admission: AD | Admit: 2015-12-28 | Discharge: 2016-01-01 | DRG: 766 | Disposition: A | Discharge status: Home / Self Care | Payer: Medicaid Other | Source: Ambulatory Visit | Attending: Obstetrics & Gynecology | Admitting: Obstetrics & Gynecology

## 2015-12-28 DIAGNOSIS — O324XX0 Maternal care for high head at term, not applicable or unspecified: Principal | ICD-10-CM | POA: Diagnosis present

## 2015-12-28 DIAGNOSIS — Z6830 Body mass index (BMI) 30.0-30.9, adult: Secondary | ICD-10-CM

## 2015-12-28 DIAGNOSIS — O283 Abnormal ultrasonic finding on antenatal screening of mother: Secondary | ICD-10-CM

## 2015-12-28 DIAGNOSIS — IMO0002 Reserved for concepts with insufficient information to code with codable children: Secondary | ICD-10-CM

## 2015-12-28 DIAGNOSIS — Z833 Family history of diabetes mellitus: Secondary | ICD-10-CM

## 2015-12-28 DIAGNOSIS — O99214 Obesity complicating childbirth: Secondary | ICD-10-CM | POA: Diagnosis present

## 2015-12-28 DIAGNOSIS — E669 Obesity, unspecified: Secondary | ICD-10-CM | POA: Diagnosis present

## 2015-12-28 DIAGNOSIS — Z98891 History of uterine scar from previous surgery: Secondary | ICD-10-CM

## 2015-12-28 DIAGNOSIS — O0933 Supervision of pregnancy with insufficient antenatal care, third trimester: Secondary | ICD-10-CM | POA: Insufficient documentation

## 2015-12-28 DIAGNOSIS — Z3A40 40 weeks gestation of pregnancy: Secondary | ICD-10-CM

## 2015-12-28 DIAGNOSIS — IMO0001 Reserved for inherently not codable concepts without codable children: Secondary | ICD-10-CM

## 2015-12-28 DIAGNOSIS — Z8249 Family history of ischemic heart disease and other diseases of the circulatory system: Secondary | ICD-10-CM | POA: Diagnosis not present

## 2015-12-28 DIAGNOSIS — O99810 Abnormal glucose complicating pregnancy: Secondary | ICD-10-CM | POA: Diagnosis present

## 2015-12-28 DIAGNOSIS — O321XX0 Maternal care for breech presentation, not applicable or unspecified: Secondary | ICD-10-CM | POA: Diagnosis present

## 2015-12-28 DIAGNOSIS — O34219 Maternal care for unspecified type scar from previous cesarean delivery: Secondary | ICD-10-CM

## 2015-12-28 LAB — CBC
HCT: 35 % — ABNORMAL LOW (ref 36.0–46.0)
Hemoglobin: 12 g/dL (ref 12.0–15.0)
MCH: 31.9 pg (ref 26.0–34.0)
MCHC: 34.3 g/dL (ref 30.0–36.0)
MCV: 93.1 fL (ref 78.0–100.0)
PLATELETS: 223 10*3/uL (ref 150–400)
RBC: 3.76 MIL/uL — AB (ref 3.87–5.11)
RDW: 12.7 % (ref 11.5–15.5)
WBC: 10.1 10*3/uL (ref 4.0–10.5)

## 2015-12-28 LAB — TYPE AND SCREEN
ABO/RH(D): B POS
Antibody Screen: NEGATIVE

## 2015-12-28 MED ORDER — LIDOCAINE HCL (PF) 1 % IJ SOLN
30.0000 mL | INTRAMUSCULAR | Status: DC | PRN
  Filled 2015-12-28: qty 30

## 2015-12-28 MED ORDER — LACTATED RINGERS IV SOLN
2.5000 [IU]/h | INTRAVENOUS | Status: DC
Start: 2015-12-28 — End: 2015-12-29
  Filled 2015-12-28: qty 4

## 2015-12-28 MED ORDER — LACTATED RINGERS IV SOLN
INTRAVENOUS | Status: DC
  Administered 2015-12-28 – 2015-12-29 (×5): via INTRAVENOUS

## 2015-12-28 MED ORDER — DIPHENHYDRAMINE HCL 50 MG/ML IJ SOLN: 12.5000 mg | INTRAMUSCULAR | Status: DC | PRN

## 2015-12-28 MED ORDER — ACETAMINOPHEN 325 MG PO TABS: 650.0000 mg | ORAL_TABLET | ORAL | Status: DC | PRN

## 2015-12-28 MED ORDER — PHENYLEPHRINE 40 MCG/ML (10ML) SYRINGE FOR IV PUSH (FOR BLOOD PRESSURE SUPPORT)
80.0000 ug | PREFILLED_SYRINGE | INTRAVENOUS | Status: DC | PRN
  Administered 2015-12-29 (×2): 40 ug via INTRAVENOUS
  Filled 2015-12-28: qty 20

## 2015-12-28 MED ORDER — EPHEDRINE 5 MG/ML INJ
10.0000 mg | INTRAVENOUS | Status: DC | PRN
  Administered 2015-12-29: 10 mg via INTRAVENOUS

## 2015-12-28 MED ORDER — LIDOCAINE HCL (PF) 1 % IJ SOLN
INTRAMUSCULAR | Status: DC | PRN
  Administered 2015-12-28 (×2): 4 mL

## 2015-12-28 MED ORDER — FLEET ENEMA 7-19 GM/118ML RE ENEM: 1.0000 | ENEMA | RECTAL | Status: DC | PRN

## 2015-12-28 MED ORDER — ZOLPIDEM TARTRATE 5 MG PO TABS: 5.0000 mg | ORAL_TABLET | Freq: Once | ORAL | Status: DC

## 2015-12-28 MED ORDER — OXYCODONE-ACETAMINOPHEN 5-325 MG PO TABS
1.0000 | ORAL_TABLET | ORAL | Status: DC | PRN
  Administered 2015-12-28: 1 via ORAL
  Filled 2015-12-28: qty 1

## 2015-12-28 MED ORDER — OXYTOCIN BOLUS FROM INFUSION
500.0000 mL | INTRAVENOUS | Status: DC
Start: 2015-12-28 — End: 2015-12-29

## 2015-12-28 MED ORDER — CITRIC ACID-SODIUM CITRATE 334-500 MG/5ML PO SOLN
30.0000 mL | ORAL | Status: DC | PRN
  Administered 2015-12-29: 30 mL via ORAL
  Filled 2015-12-28: qty 15

## 2015-12-28 MED ORDER — LACTATED RINGERS IV SOLN
500.0000 mL | INTRAVENOUS | Status: DC | PRN
  Administered 2015-12-28: 500 mL via INTRAVENOUS
  Administered 2015-12-29: 1000 mL via INTRAVENOUS
  Administered 2015-12-29 (×3): 500 mL via INTRAVENOUS

## 2015-12-28 MED ORDER — FENTANYL 2.5 MCG/ML BUPIVACAINE 1/10 % EPIDURAL INFUSION (WH - ANES)
14.0000 mL/h | INTRAMUSCULAR | Status: DC | PRN
  Administered 2015-12-28: 12.5 mL/h via EPIDURAL
  Administered 2015-12-29: 14 mL/h via EPIDURAL
  Filled 2015-12-28 (×2): qty 125

## 2015-12-28 MED ORDER — OXYCODONE-ACETAMINOPHEN 5-325 MG PO TABS
2.0000 | ORAL_TABLET | ORAL | Status: DC | PRN
Start: 2015-12-28 — End: 2015-12-29

## 2015-12-28 MED ORDER — ONDANSETRON HCL 4 MG/2ML IJ SOLN
4.0000 mg | Freq: Four times a day (QID) | INTRAMUSCULAR | Status: DC | PRN
  Administered 2015-12-29: 4 mg via INTRAVENOUS
  Filled 2015-12-28: qty 2

## 2015-12-28 NOTE — Progress Notes (Signed)
Provider notified of pt arrival to MAU.  Notified of cervical exam.  Notified of FHR tracing and decelerations in the heart rate.  Provider states to recheck in 1-2 hours.

## 2015-12-28 NOTE — Anesthesia Preprocedure Evaluation (Signed)
Anesthesia Evaluation  Patient identified by MRN, date of birth, ID band Patient awake    Reviewed: Allergy & Precautions, NPO status , Patient's Chart, lab work & pertinent test results  History of Anesthesia Complications Negative for: history of anesthetic complications  Airway Mallampati: III  TM Distance: >3 FB Neck ROM: Full    Dental no notable dental hx. (+) Dental Advisory Given   Pulmonary neg pulmonary ROS,    Pulmonary exam normal breath sounds clear to auscultation       Cardiovascular negative cardio ROS Normal cardiovascular exam Rhythm:Regular Rate:Normal     Neuro/Psych negative neurological ROS  negative psych ROS   GI/Hepatic negative GI ROS, Neg liver ROS,   Endo/Other  obesity  Renal/GU negative Renal ROS  negative genitourinary   Musculoskeletal negative musculoskeletal ROS (+)   Abdominal   Peds negative pediatric ROS (+)  Hematology negative hematology ROS (+)   Anesthesia Other Findings   Reproductive/Obstetrics (+) Pregnancy                             Anesthesia Physical Anesthesia Plan  ASA: II  Anesthesia Plan: Epidural   Post-op Pain Management:    Induction:   Airway Management Planned:   Additional Equipment:   Intra-op Plan:   Post-operative Plan:   Informed Consent: I have reviewed the patients History and Physical, chart, labs and discussed the procedure including the risks, benefits and alternatives for the proposed anesthesia with the patient or authorized representative who has indicated his/her understanding and acceptance.   Dental advisory given  Plan Discussed with: CRNA  Anesthesia Plan Comments:         Anesthesia Quick Evaluation

## 2015-12-28 NOTE — Progress Notes (Signed)
Dr. Ottie Glazier notified of that pt would like pain medication.  States she will talk to Dr. Ashok Pall and review the tracing before ordering pain medication.

## 2015-12-28 NOTE — MAU Note (Signed)
Pt states she has been having contractions since 0500 yesterday morning.  Pt states the contractions are every 3 minutes.  Pt states she is feeling the baby move.

## 2015-12-28 NOTE — MAU Note (Signed)
Lower abd pain since 2100. Pain comes and goes. Denies LOF or bleeding

## 2015-12-28 NOTE — Anesthesia Procedure Notes (Addendum)
Epidural Patient location during procedure: OB  Staffing Anesthesiologist: Shaneca Orne Performed by: anesthesiologist   Preanesthetic Checklist Completed: patient identified, site marked, surgical consent, pre-op evaluation, timeout performed, IV checked, risks and benefits discussed and monitors and equipment checked  Epidural Patient position: sitting Prep: site prepped and draped and DuraPrep Patient monitoring: continuous pulse ox and blood pressure Approach: midline Location: L3-L4 Injection technique: LOR saline  Needle:  Needle type: Tuohy  Needle gauge: 17 G Needle length: 9 cm and 9 Needle insertion depth: 5 cm cm Catheter type: closed end flexible Catheter size: 19 Gauge Catheter at skin depth: 10 cm Test dose: negative  Assessment Events: blood not aspirated, injection not painful, no injection resistance, negative IV test and paresthesia (R paresthesia while threading the catheter, immediately resolved and catheter removed, readjusted tuohy to the L and catheter easily replaced with no  paresthesia)  Additional Notes Patient identified. Risks/Benefits/Options discussed with patient including but not limited to bleeding, infection, nerve damage, paralysis, failed block, incomplete pain control, headache, blood pressure changes, nausea, vomiting, reactions to medication both or allergic, itching and postpartum back pain. Confirmed with bedside nurse the patient's most recent platelet count. Confirmed with patient that they are not currently taking any anticoagulation, have any bleeding history or any family history of bleeding disorders. Patient expressed understanding and wished to proceed. All questions were answered. Sterile technique was used throughout the entire procedure. Please see nursing notes for vital signs. Test dose was given through epidural catheter and negative prior to continuing to dose epidural or start infusion. Warning signs of high block given to the  patient including shortness of breath, tingling/numbness in hands, complete motor block, or any concerning symptoms with instructions to call for help. Patient was given instructions on fall risk and not to get out of bed. All questions and concerns addressed with instructions to call with any issues or inadequate analgesia.

## 2015-12-28 NOTE — H&P (Signed)
LABOR ADMISSION HISTORY AND PHYSICAL  Claudia Lucas is a 20 y.o. female G1P0 with IUP at [redacted]w[redacted]d by LMP and "previous US" (not in chart)  presenting for contractions.  Contractions started 1/27 around 5am. More recently contractions have become more intense and are about 2-3 minutes apart. She reports +FM, + contractions, No LOF, no VB (except for some bloody show while in MAU), no blurry vision, headaches or peripheral edema, and RUQ pain.  She plans on breast feeding. She is unsure about method of contraception. Patient is a refugee from [place]. Patient had OB care in [place] and initiated care at [facility] at [redacted]w[redacted]d. Patient was seen in MAU early this AM around 3AM; cervix at that time was 0.5cm. Cervix this evening at evaluation in MAU is 3 cm.   Dating: By LMP and "previous" [place] (not in chart) --->  Estimated Date of Delivery: 12/24/15  Sono:    , CWD, normal anatomy, cephalic presentation, 2196g, 16% EFW  Prenatal History/Complications: Patient is a refugee from [place]. Patient had OB care in [place] and initiated care at [facility] at [redacted]w[redacted]d.  Breech malposition successfully converted to cephalic 1 hr glucola failed but passed 3 hr GBS negative  Past Medical History: Past Medical History  Diagnosis Date  . Anemia   . Ovarian cyst     Past Surgical History: Past Surgical History  Procedure Laterality Date  . No past surgeries      Obstetrical History: OB History    Gravida Para Term Preterm AB TAB SAB Ectopic Multiple Living   1               Social History: Social History   Social History  . Marital Status: Married    Spouse Name: N/A  . Number of Children: N/A  . Years of Education: N/A   Social History Main Topics  . Smoking status: Never Smoker   . Smokeless tobacco: Never Used  . Alcohol Use: No  . Drug Use: No  . Sexual Activity: Yes    Birth Control/ Protection: None   Other Topics Concern  . None   Social History Narrative    Family History: Family  History  Problem Relation Age of Onset  . Diabetes Mother   . Hypertension Mother   . Hypertension Maternal Aunt   . Diabetes Maternal Aunt   . Hypertension Maternal Uncle   . Diabetes Maternal Uncle   . Hypertension Maternal Grandmother   . Diabetes Maternal Grandmother   . Hypertension Maternal Grandfather   . Diabetes Maternal Grandfather   . Cancer Neg Hx   . Heart disease Neg Hx   . Stroke Neg Hx     Allergies: No Known Allergies  Prescriptions prior to admission  Medication Sig Dispense Refill Last Dose  . Prenatal Vit-Fe Fumarate-FA (PRENATAL VITAMINS) 28-0.8 MG TABS Take 1 tablet by mouth daily. 30 tablet 5 12/28/2015 at Unknown time    Review of Systems   All systems reviewed and negative except as stated in HPI  BP 121/79 mmHg  Pulse 89  Temp(Src) 97.4 F (36.3 C) (Oral)  Resp 16  LMP 03/15/2015 (Exact Date) General appearance: alert and cooperative Lungs: clear to auscultation bilaterally Heart: regular rate and rhythm Abdomen: soft, non-tender; bowel sounds normal Extremities: Homans sign is negative, no sign of DVT, edema Fetal monitoringBaseline: 130 bpm, Variability: Good {> 6 bpm), Accelerations: Reactive and Decelerations: Late x1 over the last on tracing Uterine activity: no contractions recorded on toco over the  last 20 mins. However, while in MAU noted to have contractions every 6-7 mins.  Dilation: 3 Effacement (%): 70, 80 Station: -2 Exam by:: Janeth Rase, RN   Prenatal labs: ABO, Rh: B/POS/-- (12/15 1017) Antibody: NEG (12/15 1017) Rubella: Immune RPR: NON REAC (12/15 1017)  HBsAg: NEGATIVE (12/15 1017)  HIV: NONREACTIVE (12/15 1017)  GBS: Negative (12/29 0000)  1 hr Glucola 144; passed 3hr glucola  Genetic screening: not done Anatomy US: nml at 34 weeks but with limited views of profile, DA, and CI  Prenatal Transfer Tool  Maternal Diabetes: No; failed 1hr glucola; passed 3 hr glucola  Genetic Screening: not done   Maternal Ultrasounds/Referrals: Normal; breech malpresentation successfully converted to cephalic  Fetal Ultrasounds or other Referrals:  None Maternal Substance Abuse:  No Significant Maternal Medications:  None Significant Maternal Lab Results: Lab values include: Group B Strep negative  No results found for this or any previous visit (from the past 24 hour(s)).  Patient Active Problem List   Diagnosis Date Noted  . Breech malpresentation successfully converted to cephalic presentation 12/06/2015  . Supervision of low-risk first pregnancy 11/14/2015    Assessment: Claudia Lucas is a 20 y.o. G1P0 at [redacted]w[redacted]d here for SOL.   #Labor: SOL, consider FB to  #Pain: patient desires epidural  #FWB: Cat 2; one late decel, however moderate variability with + accelearations  #ID: GBS negative  #MOF: breast #MOC: uncertain #Circ: yes; but per patient "will do eventually" (likely outpt)  Palma Holter, MD  OB FELLOW HISTORY AND PHYSICAL ATTESTATION  I have seen and examined this patient; I agree with above documentation in the resident's note. Latent labor, but with category 2 strip in mau. Given pt is full term, think prudent for admission, and augmentation if necessary. Will make peds aware breech presentation after 34 weeks (successfully verted).   Cherrie Gauze Wouk 12/28/2015, 11:02 PM

## 2015-12-28 NOTE — Discharge Instructions (Signed)
Braxton Hicks Contractions °Contractions of the uterus can occur throughout pregnancy. Contractions are not always a sign that you are in labor.  °WHAT ARE BRAXTON HICKS CONTRACTIONS?  °Contractions that occur before labor are called Braxton Hicks contractions, or false labor. Toward the end of pregnancy (32-34 weeks), these contractions can develop more often and may become more forceful. This is not true labor because these contractions do not result in opening (dilatation) and thinning of the cervix. They are sometimes difficult to tell apart from true labor because these contractions can be forceful and people have different pain tolerances. You should not feel embarrassed if you go to the hospital with false labor. Sometimes, the only way to tell if you are in true labor is for your health care provider to look for changes in the cervix. °If there are no prenatal problems or other health problems associated with the pregnancy, it is completely safe to be sent home with false labor and await the onset of true labor. °HOW CAN YOU TELL THE DIFFERENCE BETWEEN TRUE AND FALSE LABOR? °False Labor °· The contractions of false labor are usually shorter and not as hard as those of true labor.   °· The contractions are usually irregular.   °· The contractions are often felt in the front of the lower abdomen and in the groin.   °· The contractions may go away when you walk around or change positions while lying down.   °· The contractions get weaker and are shorter lasting as time goes on.   °· The contractions do not usually become progressively stronger, regular, and closer together as with true labor.   °True Labor °· Contractions in true labor last 30-70 seconds, become very regular, usually become more intense, and increase in frequency.   °· The contractions do not go away with walking.   °· The discomfort is usually felt in the top of the uterus and spreads to the lower abdomen and low back.   °· True labor can be  determined by your health care provider with an exam. This will show that the cervix is dilating and getting thinner.   °WHAT TO REMEMBER °· Keep up with your usual exercises and follow other instructions given by your health care provider.   °· Take medicines as directed by your health care provider.   °· Keep your regular prenatal appointments.   °· Eat and drink lightly if you think you are going into labor.   °· If Braxton Hicks contractions are making you uncomfortable:   °¨ Change your position from lying down or resting to walking, or from walking to resting.   °¨ Sit and rest in a tub of warm water.   °¨ Drink 2-3 glasses of water. Dehydration may cause these contractions.   °¨ Do slow and deep breathing several times an hour.   °WHEN SHOULD I SEEK IMMEDIATE MEDICAL CARE? °Seek immediate medical care if: °· Your contractions become stronger, more regular, and closer together.   °· You have fluid leaking or gushing from your vagina.   °· You have a fever.   °· You pass blood-tinged mucus.   °· You have vaginal bleeding.   °· You have continuous abdominal pain.   °· You have low back pain that you never had before.   °· You feel your baby's head pushing down and causing pelvic pressure.   °· Your baby is not moving as much as it used to.   °  °This information is not intended to replace advice given to you by your health care provider. Make sure you discuss any questions you have with your health care   provider.   Document Released: 11/16/2005 Document Revised: 11/21/2013 Document Reviewed: 08/28/2013 Elsevier Interactive Patient Education [employer] Else[employer] Inc[employer] Third Trimester of Pregnancy The third trimester is from week 29 through week 42, months 7 through 9. This trimester is when your unborn baby (fetus) is growing very fast. At the end of the ninth month, the unborn baby is about 20 inches in length. It weighs about 6-10 pounds.  HOME CARE   Avoid all smoking, herbs, and alcohol. Avoid drugs not  approved by your doctor.  Do not use any tobacco products, including cigarettes, chewing tobacco, and electronic cigarettes. If you need help quitting, ask your doctor. You may get counseling or other support to help you quit.  Only take medicine as told by your doctor. Some medicines are safe and some are not during pregnancy.  Exercise only as told by your doctor. Stop exercising if you start having cramps.  Eat regular, healthy meals.  Wear a good support bra if your breasts are tender.  Do not use hot tubs, steam rooms, or saunas.  Wear your seat belt when driving.  Avoid raw meat, uncooked cheese, and liter boxes and soil used by cats.  Take your prenatal vitamins.  Take 1500-2000 milligrams of calcium daily starting at the 20th week of pregnancy until you deliver your baby.  Try taking medicine that helps you poop (stool softener) as needed, and if your doctor approves. Eat more fiber by eating fresh fruit, vegetables, and whole grains. Drink enough fluids to keep your pee (urine) clear or pale yellow.  Take warm water baths (sitz baths) to soothe pain or discomfort caused by hemorrhoids. Use hemorrhoid cream if your doctor approves.  If you have puffy, bulging veins (varicose veins), wear support hose. Raise (elevate) your feet for 15 minutes, 3-4 times a day. Limit salt in your diet.  Avoid heavy lifting, wear low heels, and sit up straight.  Rest with your legs raised if you have leg cramps or low back pain.  Visit your dentist if you have not gone during your pregnancy. Use a soft toothbrush to brush your teeth. Be gentle when you floss.  You can have sex (intercourse) unless your doctor tells you not to.  Do not travel far distances unless you must. Only do so with your doctor's approval.  Take prenatal classes.  Practice driving to the hospital.  Pack your hospital bag.  Prepare the baby's room.  Go to your doctor visits. GET HELP IF:  You are not sure if  you are in labor or if your water has broken.  You are dizzy.  You have mild cramps or pressure in your lower belly (abdominal).  You have a nagging pain in your belly area.  You continue to feel sick to your stomach (nauseous), throw up (vomit), or have watery poop (diarrhea).  You have bad smelling fluid coming from your vagina.  You have pain with peeing (urination). GET HELP RIGHT AWAY IF:   You have a fever.  You are leaking fluid from your vagina.  You are spotting or bleeding from your vagina.  You have severe belly cramping or pain.  You lose or gain weight rapidly.  You have trouble catching your breath and have chest pain.  You notice sudden or extreme puffiness (swelling) of your face, hands, ankles, feet, or legs.  You have not felt the baby move in over an hour.  You have severe headaches that do not go away with medicine.  You  have vision changes.   This information is not intended to replace advice given to you by your health care provider. Make sure you discuss any questions you have with your health care provider.   Document Released: 02/10/2010 Document Revised: 12/07/2014 Document Reviewed: 01/17/2013 Elsevier Interactive Patient Education 2016 Elsevier Inc. Fetal Movement Counts Patient Name: __________________________________________________ Patient Due Date: ____________________ Performing a fetal movement count is highly recommended in high-risk pregnancies, but it is good for every pregnant woman to do. Your health care provider may ask you to start counting fetal movements at 28 weeks of the pregnancy. Fetal movements often increase:  After eating a full meal.  After physical activity.  After eating or drinking something sweet or cold.  At rest. Pay attention to when you feel the baby is most active. This will help you notice a pattern of your baby's sleep and wake cycles and what factors contribute to an increase in fetal movement. It is  important to perform a fetal movement count at the same time each day when your baby is normally most active.  HOW TO COUNT FETAL MOVEMENTS  Find a quiet and comfortable area to sit or lie down on your left side. Lying on your left side provides the best blood and oxygen circulation to your baby.  Write down the day and time on a sheet of paper or in a journal.  Start counting kicks, flutters, swishes, rolls, or jabs in a 2-hour period. You should feel at least 10 movements within 2 hours.  If you do not feel 10 movements in 2 hours, wait 2-3 hours and count again. Look for a change in the pattern or not enough counts in 2 hours. SEEK MEDICAL CARE IF:  You feel less than 10 counts in 2 hours, tried twice.  There is no movement in over an hour.  The pattern is changing or taking longer each day to reach 10 counts in 2 hours.  You feel the baby is not moving as he or she usually does. Date: ____________ Movements: ____________ Start time: ____________ Doreatha Martin time: ____________  Date: ____________ Movements: ____________ Start time: ____________ Doreatha Martin time: ____________ Date: ____________ Movements: ____________ Start time: ____________ Doreatha Martin time: ____________ Date: ____________ Movements: ____________ Start time: ____________ Doreatha Martin time: ____________ Date: ____________ Movements: ____________ Start time: ____________ Doreatha Martin time: ____________ Date: ____________ Movements: ____________ Start time: ____________ Doreatha Martin time: ____________ Date: ____________ Movements: ____________ Start time: ____________ Doreatha Martin time: ____________ Date: ____________ Movements: ____________ Start time: ____________ Doreatha Martin time: ____________  Date: ____________ Movements: ____________ Start time: ____________ Doreatha Martin time: ____________ Date: ____________ Movements: ____________ Start time: ____________ Doreatha Martin time: ____________ Date: ____________ Movements: ____________ Start time: ____________ Doreatha Martin time:  ____________ Date: ____________ Movements: ____________ Start time: ____________ Doreatha Martin time: ____________ Date: ____________ Movements: ____________ Start time: ____________ Doreatha Martin time: ____________ Date: ____________ Movements: ____________ Start time: ____________ Doreatha Martin time: ____________ Date: ____________ Movements: ____________ Start time: ____________ Doreatha Martin time: ____________  Date: ____________ Movements: ____________ Start time: ____________ Doreatha Martin time: ____________ Date: ____________ Movements: ____________ Start time: ____________ Doreatha Martin time: ____________ Date: ____________ Movements: ____________ Start time: ____________ Doreatha Martin time: ____________ Date: ____________ Movements: ____________ Start time: ____________ Doreatha Martin time: ____________ Date: ____________ Movements: ____________ Start time: ____________ Doreatha Martin time: ____________ Date: ____________ Movements: ____________ Start time: ____________ Doreatha Martin time: ____________ Date: ____________ Movements: ____________ Start time: ____________ Doreatha Martin time: ____________  Date: ____________ Movements: ____________ Start time: ____________ Doreatha Martin time: ____________ Date: ____________ Movements: ____________ Start time: ____________ Doreatha Martin time: ____________ Date: ____________ Movements: ____________ Start time: ____________  Finish time: ____________ Date: ____________ Movements: ____________ Start time: ____________ Elizebeth Koller time: ____________ Date: ____________ Movements: ____________ Start time: ____________ Elizebeth Koller time: ____________ Date: ____________ Movements: ____________ Start time: ____________ Elizebeth Koller time: ____________ Date: ____________ Movements: ____________ Start time: ____________ Elizebeth Koller time: ____________  Date: ____________ Movements: ____________ Start time: ____________ Elizebeth Koller time: ____________ Date: ____________ Movements: ____________ Start time: ____________ Elizebeth Koller time: ____________ Date: ____________ Movements:  ____________ Start time: ____________ Elizebeth Koller time: ____________ Date: ____________ Movements: ____________ Start time: ____________ Elizebeth Koller time: ____________ Date: ____________ Movements: ____________ Start time: ____________ Elizebeth Koller time: ____________ Date: ____________ Movements: ____________ Start time: ____________ Elizebeth Koller time: ____________ Date: ____________ Movements: ____________ Start time: ____________ Elizebeth Koller time: ____________  Date: ____________ Movements: ____________ Start time: ____________ Elizebeth Koller time: ____________ Date: ____________ Movements: ____________ Start time: ____________ Elizebeth Koller time: ____________ Date: ____________ Movements: ____________ Start time: ____________ Elizebeth Koller time: ____________ Date: ____________ Movements: ____________ Start time: ____________ Elizebeth Koller time: ____________ Date: ____________ Movements: ____________ Start time: ____________ Elizebeth Koller time: ____________ Date: ____________ Movements: ____________ Start time: ____________ Elizebeth Koller time: ____________ Date: ____________ Movements: ____________ Start time: ____________ Elizebeth Koller time: ____________  Date: ____________ Movements: ____________ Start time: ____________ Elizebeth Koller time: ____________ Date: ____________ Movements: ____________ Start time: ____________ Elizebeth Koller time: ____________ Date: ____________ Movements: ____________ Start time: ____________ Elizebeth Koller time: ____________ Date: ____________ Movements: ____________ Start time: ____________ Elizebeth Koller time: ____________ Date: ____________ Movements: ____________ Start time: ____________ Elizebeth Koller time: ____________ Date: ____________ Movements: ____________ Start time: ____________ Elizebeth Koller time: ____________ Date: ____________ Movements: ____________ Start time: ____________ Elizebeth Koller time: ____________  Date: ____________ Movements: ____________ Start time: ____________ Elizebeth Koller time: ____________ Date: ____________ Movements: ____________ Start time: ____________ Elizebeth Koller  time: ____________ Date: ____________ Movements: ____________ Start time: ____________ Elizebeth Koller time: ____________ Date: ____________ Movements: ____________ Start time: ____________ Elizebeth Koller time: ____________ Date: ____________ Movements: ____________ Start time: ____________ Elizebeth Koller time: ____________ Date: ____________ Movements: ____________ Start time: ____________ Elizebeth Koller time: ____________   This information is not intended to replace advice given to you by your health care provider. Make sure you discuss any questions you have with your health care provider.   Document Released: 12/16/2006 Document Revised: 12/07/2014 Document Reviewed: 09/12/2012 Elsevier Interactive Patient Education [employer].

## 2015-12-28 NOTE — Progress Notes (Signed)
Dr. Ottie Glazier notified of FHR tracing.  Notified of new cervical exam.  States she will review the tracing with Dr. Ashok Pall and call back.

## 2015-12-28 NOTE — Progress Notes (Signed)
Dr. Ottie Glazier called the unit and reviewed the [facility] tracing with the nurse.  States she will call Dr. Ashok Pall and review the [facility] tracing with him and call the unit with further orders.

## 2015-12-29 ENCOUNTER — Encounter (HOSPITAL_COMMUNITY)
Admission: AD | Disposition: A | Discharge status: Home / Self Care | Payer: Self-pay | Source: Ambulatory Visit | Attending: Obstetrics & Gynecology

## 2015-12-29 ENCOUNTER — Encounter (HOSPITAL_COMMUNITY): Payer: Self-pay | Admitting: *Deleted

## 2015-12-29 DIAGNOSIS — O324XX0 Maternal care for high head at term, not applicable or unspecified: Secondary | ICD-10-CM

## 2015-12-29 DIAGNOSIS — Z3A40 40 weeks gestation of pregnancy: Secondary | ICD-10-CM

## 2015-12-29 LAB — ABO/RH: ABO/RH(D): B POS

## 2015-12-29 LAB — RPR: RPR Ser Ql: NONREACTIVE

## 2015-12-29 SURGERY — Surgical Case
Anesthesia: Epidural

## 2015-12-29 MED ORDER — DIPHENHYDRAMINE HCL 25 MG PO CAPS: 25.0000 mg | ORAL_CAPSULE | Freq: Four times a day (QID) | ORAL | Status: DC | PRN

## 2015-12-29 MED ORDER — MEPERIDINE HCL 25 MG/ML IJ SOLN: 6.2500 mg | INTRAMUSCULAR | Status: DC | PRN

## 2015-12-29 MED ORDER — CEFAZOLIN SODIUM-DEXTROSE 2-3 GM-% IV SOLR
INTRAVENOUS | Status: DC | PRN
  Administered 2015-12-29: 2 g via INTRAVENOUS

## 2015-12-29 MED ORDER — OXYCODONE HCL 5 MG/5ML PO SOLN: 5.0000 mg | Freq: Once | ORAL | Status: DC | PRN

## 2015-12-29 MED ORDER — NALOXONE HCL 2 MG/2ML IJ SOSY
1.0000 ug/kg/h | PREFILLED_SYRINGE | INTRAVENOUS | Status: DC | PRN
  Filled 2015-12-29: qty 2

## 2015-12-29 MED ORDER — LANOLIN HYDROUS EX OINT: 1.0000 "application " | TOPICAL_OINTMENT | CUTANEOUS | Status: DC | PRN

## 2015-12-29 MED ORDER — DIPHENHYDRAMINE HCL 50 MG/ML IJ SOLN: 12.5000 mg | INTRAMUSCULAR | Status: DC | PRN

## 2015-12-29 MED ORDER — MEASLES, MUMPS & RUBELLA VAC ~~LOC~~ INJ: 0.5000 mL | INJECTION | Freq: Once | SUBCUTANEOUS | Status: DC

## 2015-12-29 MED ORDER — SCOPOLAMINE 1 MG/3DAYS TD PT72
1.0000 | MEDICATED_PATCH | Freq: Once | TRANSDERMAL | Status: DC
  Filled 2015-12-29: qty 1

## 2015-12-29 MED ORDER — DIPHENHYDRAMINE HCL 25 MG PO CAPS: 25.0000 mg | ORAL_CAPSULE | ORAL | Status: DC | PRN

## 2015-12-29 MED ORDER — PRENATAL MULTIVITAMIN CH
1.0000 | ORAL_TABLET | Freq: Every day | ORAL | Status: DC
  Administered 2015-12-30 – 2015-12-31 (×2): 1 via ORAL
  Filled 2015-12-29 (×2): qty 1

## 2015-12-29 MED ORDER — ONDANSETRON HCL 4 MG/2ML IJ SOLN
INTRAMUSCULAR | Status: AC
  Filled 2015-12-29: qty 2

## 2015-12-29 MED ORDER — SIMETHICONE 80 MG PO CHEW
80.0000 mg | CHEWABLE_TABLET | ORAL | Status: DC
  Administered 2015-12-31 – 2016-01-01 (×2): 80 mg via ORAL
  Filled 2015-12-29 (×2): qty 1

## 2015-12-29 MED ORDER — MORPHINE SULFATE (PF) 0.5 MG/ML IJ SOLN
INTRAMUSCULAR | Status: AC
  Filled 2015-12-29: qty 10

## 2015-12-29 MED ORDER — KETOROLAC TROMETHAMINE 30 MG/ML IJ SOLN: 30.0000 mg | Freq: Four times a day (QID) | INTRAMUSCULAR | Status: AC | PRN

## 2015-12-29 MED ORDER — LACTATED RINGERS IV SOLN
INTRAVENOUS | Status: DC
  Administered 2015-12-29 – 2015-12-30 (×2): via INTRAVENOUS

## 2015-12-29 MED ORDER — ZOLPIDEM TARTRATE 5 MG PO TABS: 5.0000 mg | ORAL_TABLET | Freq: Every evening | ORAL | Status: DC | PRN

## 2015-12-29 MED ORDER — ONDANSETRON HCL 4 MG/2ML IJ SOLN
INTRAMUSCULAR | Status: DC | PRN
  Administered 2015-12-29: 4 mg via INTRAVENOUS

## 2015-12-29 MED ORDER — OXYTOCIN 10 UNIT/ML IJ SOLN
INTRAMUSCULAR | Status: AC
  Filled 2015-12-29: qty 4

## 2015-12-29 MED ORDER — HYDROMORPHONE HCL 1 MG/ML IJ SOLN: 0.2500 mg | INTRAMUSCULAR | Status: DC | PRN

## 2015-12-29 MED ORDER — TERBUTALINE SULFATE 1 MG/ML IJ SOLN
INTRAMUSCULAR | Status: AC
  Administered 2015-12-29: 0.25 mg
  Filled 2015-12-29: qty 1

## 2015-12-29 MED ORDER — MORPHINE SULFATE (PF) 0.5 MG/ML IJ SOLN
INTRAMUSCULAR | Status: DC | PRN
  Administered 2015-12-29: 3 mg via EPIDURAL

## 2015-12-29 MED ORDER — FENTANYL CITRATE (PF) 100 MCG/2ML IJ SOLN
INTRAMUSCULAR | Status: AC
Start: 2015-12-29 — End: 2015-12-29
  Filled 2015-12-29: qty 2

## 2015-12-29 MED ORDER — ACETAMINOPHEN 500 MG PO TABS
1000.0000 mg | ORAL_TABLET | Freq: Four times a day (QID) | ORAL | Status: AC
  Administered 2015-12-30 (×2): 1000 mg via ORAL
  Filled 2015-12-29 (×2): qty 2

## 2015-12-29 MED ORDER — ONDANSETRON HCL 4 MG/2ML IJ SOLN: 4.0000 mg | Freq: Three times a day (TID) | INTRAMUSCULAR | Status: DC | PRN

## 2015-12-29 MED ORDER — ACETAMINOPHEN 325 MG PO TABS
650.0000 mg | ORAL_TABLET | ORAL | Status: DC | PRN
  Administered 2015-12-31: 650 mg via ORAL
  Filled 2015-12-29: qty 2

## 2015-12-29 MED ORDER — TETANUS-DIPHTH-ACELL PERTUSSIS 5-2.5-18.5 LF-MCG/0.5 IM SUSP: 0.5000 mL | Freq: Once | INTRAMUSCULAR | Status: DC

## 2015-12-29 MED ORDER — OXYTOCIN 10 UNIT/ML IJ SOLN
40.0000 [IU] | INTRAMUSCULAR | Status: DC | PRN
  Administered 2015-12-29: 40 [IU] via INTRAVENOUS

## 2015-12-29 MED ORDER — NALBUPHINE HCL 10 MG/ML IJ SOLN: 5.0000 mg | INTRAMUSCULAR | Status: DC | PRN

## 2015-12-29 MED ORDER — DIBUCAINE 1 % RE OINT: 1.0000 "application " | TOPICAL_OINTMENT | RECTAL | Status: DC | PRN

## 2015-12-29 MED ORDER — TERBUTALINE SULFATE 1 MG/ML IJ SOLN
0.2500 mg | Freq: Once | INTRAMUSCULAR | Status: AC | PRN
  Administered 2015-12-29: 0.25 mg via SUBCUTANEOUS

## 2015-12-29 MED ORDER — FENTANYL CITRATE (PF) 100 MCG/2ML IJ SOLN
INTRAMUSCULAR | Status: DC | PRN
  Administered 2015-12-29: 50 ug via INTRAVENOUS

## 2015-12-29 MED ORDER — LACTATED RINGERS IV SOLN
INTRAVENOUS | Status: DC
  Administered 2015-12-29 (×2): via INTRAUTERINE

## 2015-12-29 MED ORDER — NALBUPHINE HCL 10 MG/ML IJ SOLN: 5.0000 mg | Freq: Once | INTRAMUSCULAR | Status: DC | PRN

## 2015-12-29 MED ORDER — KETOROLAC TROMETHAMINE 30 MG/ML IJ SOLN
30.0000 mg | Freq: Four times a day (QID) | INTRAMUSCULAR | Status: DC
  Administered 2015-12-29 – 2015-12-30 (×2): 30 mg via INTRAVENOUS
  Filled 2015-12-29 (×2): qty 1

## 2015-12-29 MED ORDER — SODIUM CHLORIDE 0.9% FLUSH: 3.0000 mL | INTRAVENOUS | Status: DC | PRN

## 2015-12-29 MED ORDER — MENTHOL 3 MG MT LOZG: 1.0000 | LOZENGE | OROMUCOSAL | Status: DC | PRN

## 2015-12-29 MED ORDER — PHENYLEPHRINE HCL 10 MG/ML IJ SOLN
INTRAMUSCULAR | Status: DC | PRN
  Administered 2015-12-29 (×2): 40 ug via INTRAVENOUS

## 2015-12-29 MED ORDER — NALOXONE HCL 0.4 MG/ML IJ SOLN: 0.4000 mg | INTRAMUSCULAR | Status: DC | PRN

## 2015-12-29 MED ORDER — SENNOSIDES-DOCUSATE SODIUM 8.6-50 MG PO TABS
2.0000 | ORAL_TABLET | ORAL | Status: DC
  Administered 2015-12-31 – 2016-01-01 (×2): 2 via ORAL
  Filled 2015-12-29 (×2): qty 2

## 2015-12-29 MED ORDER — OXYTOCIN 10 UNIT/ML IJ SOLN
1.0000 m[IU]/min | INTRAVENOUS | Status: DC
  Administered 2015-12-29 (×2): 2 m[IU]/min via INTRAVENOUS

## 2015-12-29 MED ORDER — IBUPROFEN 600 MG PO TABS: 600.0000 mg | ORAL_TABLET | Freq: Four times a day (QID) | ORAL | Status: DC | PRN

## 2015-12-29 MED ORDER — PHENYLEPHRINE 40 MCG/ML (10ML) SYRINGE FOR IV PUSH (FOR BLOOD PRESSURE SUPPORT)
PREFILLED_SYRINGE | INTRAVENOUS | Status: AC
Start: 2015-12-29 — End: 2015-12-29
  Filled 2015-12-29: qty 10

## 2015-12-29 MED ORDER — OXYTOCIN 10 UNIT/ML IJ SOLN: 2.5000 [IU]/h | INTRAVENOUS | Status: AC

## 2015-12-29 MED ORDER — IBUPROFEN 600 MG PO TABS
600.0000 mg | ORAL_TABLET | Freq: Four times a day (QID) | ORAL | Status: DC
  Administered 2015-12-30 – 2016-01-01 (×8): 600 mg via ORAL
  Filled 2015-12-29 (×8): qty 1

## 2015-12-29 MED ORDER — WITCH HAZEL-GLYCERIN EX PADS: 1.0000 "application " | MEDICATED_PAD | CUTANEOUS | Status: DC | PRN

## 2015-12-29 MED ORDER — MISOPROSTOL 25 MCG QUARTER TABLET: 25.0000 ug | ORAL_TABLET | ORAL | Status: DC | PRN

## 2015-12-29 MED ORDER — OXYCODONE HCL 5 MG PO TABS: 5.0000 mg | ORAL_TABLET | Freq: Once | ORAL | Status: DC | PRN

## 2015-12-29 MED ORDER — SIMETHICONE 80 MG PO CHEW: 80.0000 mg | CHEWABLE_TABLET | ORAL | Status: DC | PRN

## 2015-12-29 SURGICAL SUPPLY — 31 items
BENZOIN TINCTURE PRP APPL 2/3 (GAUZE/BANDAGES/DRESSINGS) ×3 IMPLANT
CLAMP CORD UMBIL (MISCELLANEOUS) IMPLANT
CLOTH BEACON ORANGE TIMEOUT ST (SAFETY) ×3 IMPLANT
DRAIN JACKSON PRT FLT 7MM (DRAIN) IMPLANT
DRAPE SHEET LG 3/4 BI-LAMINATE (DRAPES) IMPLANT
DRSG OPSITE POSTOP 4X10 (GAUZE/BANDAGES/DRESSINGS) ×3 IMPLANT
DRSG PAD ABDOMINAL 8X10 ST (GAUZE/BANDAGES/DRESSINGS) ×3 IMPLANT
DURAPREP 26ML APPLICATOR (WOUND CARE) ×3 IMPLANT
ELECT REM PT RETURN 9FT ADLT (ELECTROSURGICAL) ×3
ELECTRODE REM PT RTRN 9FT ADLT (ELECTROSURGICAL) ×1 IMPLANT
EVACUATOR SILICONE 100CC (DRAIN) IMPLANT
EXTRACTOR VACUUM M CUP 4 TUBE (SUCTIONS) IMPLANT
EXTRACTOR VACUUM M CUP 4' TUBE (SUCTIONS)
GLOVE BIO SURGEON STRL SZ7 (GLOVE) ×3 IMPLANT
GLOVE BIOGEL PI IND STRL 7.0 (GLOVE) ×3 IMPLANT
GLOVE BIOGEL PI INDICATOR 7.0 (GLOVE) ×6
GOWN STRL REUS W/TWL LRG LVL3 (GOWN DISPOSABLE) ×6 IMPLANT
KIT ABG SYR 3ML LUER SLIP (SYRINGE) IMPLANT
NEEDLE HYPO 25X5/8 SAFETYGLIDE (NEEDLE) ×3 IMPLANT
NS IRRIG 1000ML POUR BTL (IV SOLUTION) ×3 IMPLANT
PACK C SECTION WH (CUSTOM PROCEDURE TRAY) ×3 IMPLANT
PAD OB MATERNITY 4.3X12.25 (PERSONAL CARE ITEMS) ×3 IMPLANT
PENCIL SMOKE EVAC W/HOLSTER (ELECTROSURGICAL) IMPLANT
RTRCTR C-SECT PINK 25CM LRG (MISCELLANEOUS) ×3 IMPLANT
SPONGE GAUZE 4X4 STERILE 39 (GAUZE/BANDAGES/DRESSINGS) ×3 IMPLANT
SUT MON AB 2-0 CT1 27 (SUTURE) ×3 IMPLANT
SUT VIC AB 0 CTX 36 (SUTURE) ×10
SUT VIC AB 0 CTX36XBRD ANBCTRL (SUTURE) ×5 IMPLANT
SUT VIC AB 4-0 KS 27 (SUTURE) ×3 IMPLANT
TOWEL OR 17X24 6PK STRL BLUE (TOWEL DISPOSABLE) ×3 IMPLANT
TRAY FOLEY CATH SILVER 14FR (SET/KITS/TRAYS/PACK) ×3 IMPLANT

## 2015-12-29 NOTE — Consult Note (Signed)
[facility]  Delivery Note:  C-section       12/29/2015  5:43 PM  I was called to the operating room at the request of the patient's obstetrician (Dr. Lorayne Marek) for a primary c-section.  PRENATAL HX:  This is a 20 y/o G1P0 at 57 and 5/[redacted] weeks gestation who was admitted yesterday for active labor.  She had the majority of her care in [place] and began care in [place] at 34 weeks.  Her pregnancy was not complicated.  She failed her 1 hour glucola but passed 3 hour.  AROM was this morning at 0935 (ROM 10 hours).  GBS Negative.  She failed to progress and infant began to have decelerations so delivery was by c-section.    DELIVERY:  Infant was vigorous at delivery, initially requiring no resuscitation other than standard warming, drying and stimulation.  APGARs 7, 8, and 9.  At 5 minutes, infant still with central cyanosis so pulse oximeter applied.  Supplemental O2 provided from 5 minutes to 10 minutes to obtain appropriate saturations.  Delee suction x2 as well.  By 10 minutes, infant had saturations in the 90s in RA.  Exam notable for bilateral post-axial polydactyly, caput, molding, and mild meconium staining but was otherwise within normal limits.  After 15 minutes, baby left with nurse to assist parents with skin-to-skin care.   _____________________ Electronically Signed By: Maryan Char, MD Neonatologist

## 2015-12-29 NOTE — Transfer of Care (Signed)
Immediate Anesthesia Transfer of Care Note  Patient: Claudia Lucas  Procedure(s) Performed: Procedure(s): CESAREAN SECTION (N/A)  Patient Location: PACU  Anesthesia Type:Epidural  Level of Consciousness: awake and alert   Airway & Oxygen Therapy: Patient Spontanous Breathing  Post-op Assessment: Report given to RN and Post -op Vital signs reviewed and stable  Post vital signs: Reviewed and stable  Last Vitals:  Filed Vitals:   12/29/15 1657 12/29/15 1709  BP:  153/125  Pulse: 89 93  Temp:    Resp:      Complications: No apparent anesthesia complications

## 2015-12-29 NOTE — Progress Notes (Signed)
Claudia Lucas is a 20 y.o. G1P0 at [redacted]w[redacted]d by ultrasound admitted for active labor  Subjective:   Objective: BP 92/42 mmHg  Pulse 90  Temp(Src) 98 F (36.7 C) (Oral)  Resp 18  Ht 5' (1.524 m)  Wt 154 lb (69.854 kg)  BMI 30.08 kg/m2  SpO2 100%  LMP 03/15/2015 (Exact Date) I/O last 3 completed shifts: In: -  Out: 1300 [Urine:1300] Total I/O In: 120 [P.O.:120] Out: -   FHT:  FHR: 135 bpm, variability: moderate,  accelerations:  Present,  decelerations:  Absent UC:   irregular, every 5-8 minutes and mild SVE:   Dilation: 5.5 Effacement (%): 80 Station: -2 Exam by:: veronica mensah  Labs: Lab Results  Component Value Date   WBC 10.1 12/28/2015   HGB 12.0 12/28/2015   HCT 35.0* 12/28/2015   MCV 93.1 12/28/2015   PLT 223 12/28/2015    Assessment / Plan: inadequate uc's will augment and then AROM  Labor: slow progress Preeclampsia:  no signs or symptoms of toxicity and intake and ouput balanced Fetal Wellbeing:  Category I Pain Control:  Epidural I/D:  n/a Anticipated MOD:  NSVD  LAWSON, MARIE DARLENE 12/29/2015, 9:14 AM

## 2015-12-29 NOTE — Progress Notes (Signed)
Claudia Lucas is a 20 y.o. G1P0 at [redacted]w[redacted]d in labor Subjective:   Objective: BP 97/57 mmHg  Pulse 89  Temp(Src) 98.6 F (37 C) (Axillary)  Resp 18  Ht 5' (1.524 m)  Wt 154 lb (69.854 kg)  BMI 30.08 kg/m2  SpO2 100%  LMP 03/15/2015 (Exact Date) I/O last 3 completed shifts: In: -  Out: 1300 [Urine:1300] Total I/O In: 170 [P.O.:170] Out: 650 [Urine:650]  FHT:  FHR: 150 bpm, variability: moderate,  accelerations:  Present,  decelerations:  Present variables--it was difficult to determine baseline and timing of contractions until internals were placed.   UC:   regular, every 1-2 minutes SVE:   Dilation: 10 Effacement (%): 80 Station: +2 Exam by:: Marcelino Duster, RN   Labs: Lab Results  Component Value Date   WBC 10.1 12/28/2015   HGB 12.0 12/28/2015   HCT 35.0* 12/28/2015   MCV 93.1 12/28/2015   PLT 223 12/28/2015    Assessment / Plan: Spontaneous labor, progressing normally  Labor: Progressing normally Preeclampsia:  no signs or symptoms of toxicity Fetal Wellbeing:  Category II Pain Control:  Epidural I/D:  n/a Anticipated MOD:  NSVD   Once internals were placed, FHT was reassuring.  Pitocin stopped, Amnioinfusion begun.  Herald Vallin H. 12/29/2015, 2:59 PM

## 2015-12-29 NOTE — Progress Notes (Signed)
Claudia Lucas is a 20 y.o. G1P0 at [redacted]w[redacted]d by ultrasound admitted for active labor  Subjective:   Objective: BP 98/55 mmHg  Pulse 85  Temp(Src) 99.5 F (37.5 C) (Oral)  Resp 18  Ht 5' (1.524 m)  Wt 154 lb (69.854 kg)  BMI 30.08 kg/m2  SpO2 95%  LMP 03/15/2015 (Exact Date) I/O last 3 completed shifts: In: -  Out: 1300 [Urine:1300] Total I/O In: 120 [P.O.:120] Out: 500 [Urine:500]  FHT:  FHR: 140 bpm, variability: moderate,  accelerations:  Present,  decelerations:  Present variable UC:   Mild uc's q 6-8 SVE:   Dilation: 5 Effacement (%): 80 Station: -2, -1 Exam by:: michelle, RN   Labs: Lab Results  Component Value Date   WBC 10.1 12/28/2015   HGB 12.0 12/28/2015   HCT 35.0* 12/28/2015   MCV 93.1 12/28/2015   PLT 223 12/28/2015    Assessment / Plan: Augmentation of labor, progressing well  Labor: Progressing normally Preeclampsia:  no signs or symptoms of toxicity and intake and ouput balanced Fetal Wellbeing:  Category II Pain Control:  Epidural I/D:  n/a Anticipated MOD:  NSVD  Claudia Lucas Claudia Lucas 12/29/2015, 12:07 PM

## 2015-12-29 NOTE — Progress Notes (Signed)
Claudia Lucas is a 20 y.o. G1P0 at [redacted]w[redacted]d in active labor Subjective:   Objective: BP 116/73 mmHg  Pulse 86  Temp(Src) 98.6 F (37 C) (Oral)  Resp 20  Ht 5' (1.524 m)  Wt 154 lb (69.854 kg)  BMI 30.08 kg/m2  SpO2 100%  LMP 03/15/2015 (Exact Date) I/O last 3 completed shifts: In: -  Out: 1300 [Urine:1300] Total I/O In: 170 [P.O.:170] Out: 650 [Urine:650]  FHT:  Pt has urge to push, while she was on her back pt had a 4 minute bradycardia.  Pt's position changed back to LLR and HR returned to 150 baseline, moderate variability.  Head came down 1 cm during past 30 minutes.  It feels LOT. UC:   regular, every 1-3 minutes SVE:   Dilation: 10 Effacement (%): 80 Station: +2 Exam by:: Marcelino Duster, RN   Labs: Lab Results  Component Value Date   WBC 10.1 12/28/2015   HGB 12.0 12/28/2015   HCT 35.0* 12/28/2015   MCV 93.1 12/28/2015   PLT 223 12/28/2015    Assessment / Plan: Spontaneous labor, progressing normally  Labor: Progressing normally Preeclampsia:  n/a Fetal Wellbeing:  Category II Pain Control:  Epidural I/D:  n/a Anticipated MOD:  NSVD  Dierks Wach H. 12/29/2015, 3:33 PM

## 2015-12-29 NOTE — Progress Notes (Signed)
Patient ID: Claudia Lucas, female   DOB: Dec 21, 1995, 20 y.o.   MRN: [id number]  Pt FD and pushing on and off for 3 hours.  Pt had to stop several times due to fetal decelerations.  No progress past 2 hours.   Caput at +2, head at +1.  Tight introitus and vacuum not good option.    Using Central interpreter:  The risks of cesarean section discussed with the patient included but were not limited to: bleeding which may require transfusion or reoperation; infection which may require antibiotics; injury to bowel, bladder, ureters or other surrounding organs; injury to the fetus; need for additional procedures including hysterectomy in the event of a life-threatening hemorrhage; placental abnormalities wth subsequent pregnancies, incisional problems, thromboembolic phenomenon and other postoperative/anesthesia complications. The patient concurred with the proposed plan, giving informed written consent for the procedure.  All questions answered.  Urine red tinged before start of surgery.

## 2015-12-29 NOTE — Anesthesia Postprocedure Evaluation (Signed)
Anesthesia Post Note  Patient: Claudia Lucas  Procedure(s) Performed: Procedure(s) (LRB): CESAREAN SECTION (N/A)  Patient location during evaluation: PACU Anesthesia Type: Epidural Level of consciousness: awake and alert and oriented Pain management: satisfactory to patient Vital Signs Assessment: post-procedure vital signs reviewed and stable Respiratory status: spontaneous breathing and nonlabored ventilation Cardiovascular status: blood pressure returned to baseline and stable Postop Assessment: no headache, patient able to bend at knees and no signs of nausea or vomiting Anesthetic complications: no    Last Vitals:  Filed Vitals:   12/29/15 1831 12/29/15 1845  BP: 113/92 112/71  Pulse: 109 104  Temp:  37.3 C  Resp: 20 26    Last Pain:  Filed Vitals:   12/29/15 1849  PainSc: 0-No pain                 Clerence Gubser A

## 2015-12-29 NOTE — Addendum Note (Signed)
Addendum  created 12/29/15 2209 by Sheldon Silvan, MD   Modules edited: Orders, PRL Based Order Sets

## 2015-12-29 NOTE — Op Note (Signed)
Claudia Lucas PROCEDURE DATE: 12/28/2015 - 12/29/2015  PREOPERATIVE DIAGNOSIS: Intrauterine pregnancy at  [redacted]w[redacted]d weeks gestation  POSTOPERATIVE DIAGNOSIS: The same  PROCEDURE:    Low Transverse Cesarean Section  SURGEON:  Dr. Elsie Lincoln  ASSISTANT:   INDICATIONS: [name] is a 20 y.o. G1P0 at [redacted]w[redacted]d with failure to descend after 3 hour 2nd stage.  The risks of cesarean section discussed with the patient included but were not limited to: bleeding which may require transfusion or reoperation; infection which may require antibiotics; injury to bowel, bladder, ureters or other surrounding organs; injury to the fetus; need for additional procedures including hysterectomy in the event of a life-threatening hemorrhage; placental abnormalities wth subsequent pregnancies, incisional problems, thromboembolic phenomenon and other postoperative/anesthesia complications. The patient concurred with the proposed plan, giving informed written consent for the procedure.    FINDINGS:  Viable female  infant in cephalic presentation, 8,9 Apgars, weight to be determined in 1 hour, thick mec amniotic fluid.  Intact placenta, three vessel cord.  Grossly normal uterus, ovaries and fallopian tubes. .   ANESTHESIA:    Epidural   ESTIMATED BLOOD LOSS: 800  SPECIMENS: Placenta sent to pathology  COMPLICATIONS: None immediate  PROCEDURE IN DETAIL:  The patient received intravenous antibiotics and had sequential compression devices applied to her lower extremities.  Epidural anesthesia was dosed up to surgical level) and was found to be adequate. She was then placed in a low lithotomy position with a leftward tilt, and prepped and draped in a sterile manner.  A foley catheter was already present in her bladder and attached to constant gravity.  After an adequate timeout was performed, a Pfannenstiel skin incision was made with scalpel and carried through to the underlying layer of fascia. The fascia was incised in  the midline and this incision was extended bilaterally using the Mayo scissors. Kocher clamps were applied to the superior aspect of the fascial incision and the underlying rectus muscles were dissected off bluntly. A similar process was carried out on the inferior aspect of the facial incision. The rectus muscles were separated in the midline bluntly and the peritoneum was entered bluntly.   CNM Hart Rochester helped elevate the head transvaginally.  A transverse hysterotomy was made with a scalpel and extended bilaterally bluntly. The bladder blade was then removed. The infant was successfully delivered, and cord was clamped and cut and infant was handed over to awaiting neonatology team. Uterine massage was then administered and the placenta delivered intact with three-vessel cord. The uterus was cleared of clot and debris.  The hysterotomy was closed with 0 vicryl.  A second imbricating suture of 0-Vicryl was used to reinforce the incision and aid in hemostasis.  The peritoneum and rectus muscles were noted to be hemostatic.  The peritoneum was closed with 0-Vicryl.  The fascia was closed with 0-Vicryl in a running fashion with good restoration of anatomy.  The subcutaneus tissue was copiously irrigated.  The skin was closed with 4-0 Vicryl in a subcuticular fashion.    Pt tolerated the procedure will.  All counts were correct x2.  Pt went to the recovery room in stable condition.

## 2015-12-30 ENCOUNTER — Encounter (HOSPITAL_COMMUNITY): Payer: Self-pay | Admitting: Obstetrics & Gynecology

## 2015-12-30 LAB — CBC
HCT: 29.5 % — ABNORMAL LOW (ref 36.0–46.0)
Hemoglobin: 9.9 g/dL — ABNORMAL LOW (ref 12.0–15.0)
MCH: 31.2 pg (ref 26.0–34.0)
MCHC: 33.6 g/dL (ref 30.0–36.0)
MCV: 93.1 fL (ref 78.0–100.0)
PLATELETS: 185 10*3/uL (ref 150–400)
RBC: 3.17 MIL/uL — AB (ref 3.87–5.11)
RDW: 13 % (ref 11.5–15.5)
WBC: 12.8 10*3/uL — ABNORMAL HIGH (ref 4.0–10.5)

## 2015-12-30 LAB — GC/CHLAMYDIA PROBE AMP (~~LOC~~) NOT AT ARMC
Chlamydia: NEGATIVE
Neisseria Gonorrhea: NEGATIVE

## 2015-12-30 NOTE — Lactation Note (Signed)
This note was copied from the chart of Claudia Jaydan Jaroszewski. Lactation Consultation Note  Patient Name: Claudia Lucas Date: 12/30/2015 Reason for consult: Follow-up assessment;Difficult latch  RN requesting assistance with feedings.  Baby has predominantly been getting syringes/spoonfuls of colostrum (5 ml), as he is unable to latch onto breast.  Mom has flat nipples and edematous areola which makes it difficult to compress to facilitate a deep areolar latch.  Manual expression results in a good colostrum flow, baby licking from nipple.  Nipple doesn't evert out for the nipple shield.  Baby latched shallow and sucked and swallowed while EBM instilled into shield.  After this baby stopped.  Attempted cup feeding Alimentum, but baby wasn't able to swallow, formula just dribbled out of his mouth.  Assisted Mom to double pump on Premie setting, and some colostrum noted, but unable to collect it at the end of pumping.  Assisted baby in latching again, following pumping but baby unable to attain a deep latch. Mom not moving much in bed right now, needing a lot of assistance.  Will continue to pump 20 mins every 3 hrs after trying to latch baby, and to offer any expressed colostrum to baby by spoon or syringe.  May need to use slow flow bottle if baby continues to not be able to latch well.  Consult Status Consult Status: Follow-up Date: 12/31/15 Follow-up type: In-patient    Claudia Lucas 12/30/2015, 1:25 PM

## 2015-12-30 NOTE — Progress Notes (Signed)
Subjective: Postpartum Day 1: Cesarean Delivery Patient reports incisional pain and tolerating PO.    Objective: Vital signs in last 24 hours: Temp:  [98 F (36.7 C)-99.7 F (37.6 C)] 98.8 F (37.1 C) (01/30 0500) Pulse Rate:  [74-113] 100 (01/30 0500) Resp:  [18-29] 18 (01/30 0500) BP: (90-153)/(42-125) 94/52 mmHg (01/30 0500) SpO2:  [92 %-100 %] 95 % (01/30 0500)  Physical Exam:  General: alert, cooperative, appears stated age and no distress Lochia: appropriate Uterine Fundus: firm Incision: no significant drainage, no dehiscence DVT Evaluation: Negative Homan's sign. No cords or calf tenderness.   Recent Labs  12/28/15 2205 12/30/15 0510  HGB 12.0 9.9*  HCT 35.0* 29.5*    Assessment/Plan: Status post Cesarean section. Doing well postoperatively.  Continue current care.  Anushree Dorsi DARLENE 12/30/2015, 7:22 AM

## 2015-12-30 NOTE — Lactation Note (Signed)
This note was copied from the chart of Boy Milaya Wiler. Lactation Consultation Note With [employer] interpreter,#[id number]. New mom discussed BF, taught latching, positioning, I&O, using NS, and application. Hand expression taught w/71ml colostrum expressed. Gave to baby after BF w/gloved finger and syring. Mom encouraged to feed baby 8-12 times/24 hours and with feeding cues. Referred to Baby and Me Book in Breastfeeding section Pg. 22-23 for position options and Proper latch demonstration. Educated about newborn behavior and encouraged STS. WH/LC brochure given w/resources, support groups and LC services. Answered questions mom had w/interpreter. FOB at bedside.discussed feeding amount and size of stomach. Mom said she wanted breast and formula if she didn't have enough milk. Explained important to keep up with I&O log sheet to see out put and feeding. Explained and demonstrated feeding I&O log sheet.  Patient Name: Boy VALLIE FAYETTE PIRJJ'O Date: 12/30/2015 Reason for consult: Initial assessment   Maternal Data Has patient been taught Hand Expression?: Yes  Feeding Feeding Type: Breast Milk Length of feed: 10 min  LATCH Score/Interventions Latch: Repeated attempts needed to sustain latch, nipple held in mouth throughout feeding, stimulation needed to elicit sucking reflex. Intervention(s): Skin to skin;Teach feeding cues;Waking techniques Intervention(s): Adjust position;Assist with latch;Breast massage;Breast compression  Audible Swallowing: Spontaneous and intermittent  Type of Nipple: Flat Intervention(s): Reverse pressure;Double electric pump  Comfort (Breast/Nipple): Soft / non-tender     Hold (Positioning): Full assist, staff holds infant at breast Intervention(s): Breastfeeding basics reviewed;Support Pillows;Position options;Skin to skin  LATCH Score: 6  Lactation Tools Discussed/Used Tools: Pump Nipple shield size: 20 Breast pump type: Double-Electric Breast Pump WIC  Program: Yes Pump Review: Setup, frequency, and cleaning;Milk Storage   Consult Status Consult Status: Follow-up Date: 12/30/15 (in pm) Follow-up type: In-patient    Ysabelle Goodroe, Diamond Nickel 12/30/2015, 5:09 AM

## 2015-12-30 NOTE — Progress Notes (Signed)
UR chart review completed.  

## 2015-12-30 NOTE — Addendum Note (Signed)
Addendum  created 12/30/15 1033 by Collier Flowers, CRNA   Modules edited: Clinical Notes   Clinical Notes:  File: [id number]

## 2015-12-30 NOTE — Progress Notes (Signed)
Arabic speaking husband stated he had no one to bring him food nor did he have the means to order via food service. A voucher was given by Art therapist. Meals for dinner and breakfast were ordered through language line interpreter.

## 2015-12-30 NOTE — Progress Notes (Signed)
With interpreter # [phone], encouraged patient to ambulate in hallway and be up out of bed. Discussed pain medication, infant feeding , infant's newborn screening, circumcision, and infant's follow up. Per patient, circumcuision not desired in hospital and no follow up MD chosen yet. Questions answered.

## 2015-12-30 NOTE — Anesthesia Postprocedure Evaluation (Signed)
Anesthesia Post Note  Patient: Claudia Lucas  Procedure(s) Performed: Procedure(s) (LRB): CESAREAN SECTION (N/A)  Patient location during evaluation: Mother Baby Anesthesia Type: Epidural Level of consciousness: awake and alert, oriented and patient cooperative Pain management: pain level controlled Vital Signs Assessment: post-procedure vital signs reviewed and stable Respiratory status: spontaneous breathing and nonlabored ventilation Cardiovascular status: stable Postop Assessment: no headache, no backache, no signs of nausea or vomiting, patient able to bend at knees and adequate PO intake Anesthetic complications: no Comments: Arabic interpreter utilized for post op assessment. Pain level 2    Last Vitals:  Filed Vitals:   12/29/15 2315 12/30/15 0500  BP: 94/52 94/52  Pulse: 104 100  Temp: 37 C 37.1 C  Resp: 20 18    Last Pain:  Filed Vitals:   12/30/15 0800  PainSc: Asleep                 Kenidy Crossland

## 2015-12-31 ENCOUNTER — Other Ambulatory Visit: Payer: Medicaid Other

## 2015-12-31 DIAGNOSIS — O34219 Maternal care for unspecified type scar from previous cesarean delivery: Secondary | ICD-10-CM

## 2015-12-31 DIAGNOSIS — O99810 Abnormal glucose complicating pregnancy: Secondary | ICD-10-CM | POA: Diagnosis present

## 2015-12-31 DIAGNOSIS — Z98891 History of uterine scar from previous surgery: Secondary | ICD-10-CM

## 2015-12-31 MED ORDER — IBUPROFEN 600 MG PO TABS
600.0000 mg | ORAL_TABLET | Freq: Four times a day (QID) | ORAL | Status: DC
Start: 2015-12-31 — End: 2017-06-14

## 2015-12-31 MED ORDER — OXYCODONE-ACETAMINOPHEN 5-325 MG PO TABS
1.0000 | ORAL_TABLET | ORAL | Status: DC | PRN
  Administered 2015-12-31 (×2): 1 via ORAL
  Filled 2015-12-31 (×2): qty 1

## 2015-12-31 MED ORDER — OXYCODONE-ACETAMINOPHEN 5-325 MG PO TABS: 1.0000 | ORAL_TABLET | ORAL | Status: DC | PRN

## 2015-12-31 MED ORDER — SENNOSIDES-DOCUSATE SODIUM 8.6-50 MG PO TABS: 2.0000 | ORAL_TABLET | Freq: Every evening | ORAL | Status: DC | PRN

## 2015-12-31 MED ORDER — ACETAMINOPHEN 325 MG PO TABS: 650.0000 mg | ORAL_TABLET | ORAL | Status: DC | PRN

## 2015-12-31 NOTE — Discharge Instructions (Signed)
Postpartum Care After Cesarean Delivery After you deliver your newborn (postpartum period), the usual stay in the hospital is 24-72 hours. If there were problems with your labor or delivery, or if you have other medical problems, you might be in the hospital longer.  While you are in the hospital, you will receive help and instructions on how to care for yourself and your newborn during the postpartum period.  While you are in the hospital:  It is normal for you to have pain or discomfort from the incision in your abdomen. Be sure to tell your nurses when you are having pain, where the pain is located, and what makes the pain worse.  If you are breastfeeding, you may feel uncomfortable contractions of your uterus for a couple of weeks. This is normal. The contractions help your uterus get back to normal size.  It is normal to have some bleeding after delivery.  For the first 1-3 days after delivery, the flow is red and the amount may be similar to a period.  It is common for the flow to start and stop.  In the first few days, you may pass some small clots. Let your nurses know if you begin to pass large clots or your flow increases.  Do not  flush blood clots down the toilet before having the nurse look at them.  During the next 3-10 days after delivery, your flow should become more watery and pink or brown-tinged in color.  Ten to fourteen days after delivery, your flow should be a small amount of yellowish-white discharge.  The amount of your flow will decrease over the first few weeks after delivery. Your flow may stop in 6-8 weeks. Most women have had their flow stop by 12 weeks after delivery.  You should change your sanitary pads frequently.  Wash your hands thoroughly with soap and water for at least 20 seconds after changing pads, using the toilet, or before holding or feeding your newborn.  Your intravenous (IV) tubing will be removed when you are drinking enough fluids.  The  urine drainage tube (urinary catheter) that was inserted before delivery may be removed within 6-8 hours after delivery or when feeling returns to your legs. You should feel like you need to empty your bladder within the first 6-8 hours after the catheter has been removed.  In case you become weak, lightheaded, or faint, call your nurse before you get out of bed for the first time and before you take a shower for the first time.  Within the first few days after delivery, your breasts may begin to feel tender and full. This is called engorgement. Breast tenderness usually goes away within 48-72 hours after engorgement occurs. You may also notice milk leaking from your breasts. If you are not breastfeeding, do not stimulate your breasts. Breast stimulation can make your breasts produce more milk.  Spending as much time as possible with your newborn is very important. During this time, you and your newborn can feel close and get to know each other. Having your newborn stay in your room (rooming in) will help to strengthen the bond with your newborn. It will give you time to get to know your newborn and become comfortable caring for your newborn.  Your hormones change after delivery. Sometimes the hormone changes can temporarily cause you to feel sad or tearful. These feelings should not last more than a few days. If these feelings last longer than that, you should talk to your  caregiver.  If desired, talk to your caregiver about methods of family planning or contraception.  Talk to your caregiver about immunizations. Your caregiver may want you to have the following immunizations before leaving the hospital:  Tetanus, diphtheria, and pertussis (Tdap) or tetanus and diphtheria (Td) immunization. It is very important that you and your family (including grandparents) or others caring for your newborn are up-to-date with the Tdap or Td immunizations. The Tdap or Td immunization can help protect your newborn  from getting ill.  Rubella immunization.  Varicella (chickenpox) immunization.  Influenza immunization. You should receive this annual immunization if you did not receive the immunization during your pregnancy.   This information is not intended to replace advice given to you by your health care provider. Make sure you discuss any questions you have with your health care provider.   Document Released: 08/10/2012 Document Reviewed: 08/10/2012 Elsevier Interactive Patient Education [employer]  Contraception Choices Contraception (birth control) is the use of any methods or devices to prevent pregnancy. Below are some methods to help avoid pregnancy. HORMONAL METHODS   Contraceptive implant. This is a thin, plastic tube containing progesterone hormone. It does not contain estrogen hormone. Your health care provider inserts the tube in the inner part of the upper arm. The tube can remain in place for up to 3 years. After 3 years, the implant must be removed. The implant prevents the ovaries from releasing an egg (ovulation), thickens the cervical mucus to prevent sperm from entering the uterus, and thins the lining of the inside of the uterus.  Progesterone-only injections. These injections are given every 3 months by your health care provider to prevent pregnancy. This synthetic progesterone hormone stops the ovaries from releasing eggs. It also thickens cervical mucus and changes the uterine lining. This makes it harder for sperm to survive in the uterus.  Birth control pills. These pills contain estrogen and progesterone hormone. They work by preventing the ovaries from releasing eggs (ovulation). They also cause the cervical mucus to thicken, preventing the sperm from entering the uterus. Birth control pills are prescribed by a health care provider.Birth control pills can also be used to treat heavy periods.  Minipill. This type of birth control pill contains only the progesterone  hormone. They are taken every day of each month and must be prescribed by your health care provider.  Birth control patch. The patch contains hormones similar to those in birth control pills. It must be changed once a week and is prescribed by a health care provider.  Vaginal ring. The ring contains hormones similar to those in birth control pills. It is left in the vagina for 3 weeks, removed for 1 week, and then a new one is put back in place. The patient must be comfortable inserting and removing the ring from the vagina.A health care provider's prescription is necessary.  Emergency contraception. Emergency contraceptives prevent pregnancy after unprotected sexual intercourse. This pill can be taken right after sex or up to 5 days after unprotected sex. It is most effective the sooner you take the pills after having sexual intercourse. Most emergency contraceptive pills are available without a prescription. Check with your pharmacist. Do not use emergency contraception as your only form of birth control. BARRIER METHODS   Female condom. This is a thin sheath (latex or rubber) that is worn over the penis during sexual intercourse. It can be used with spermicide to increase effectiveness.  Female condom. This is a soft, loose-fitting sheath that  is put into the vagina before sexual intercourse.  Diaphragm. This is a soft, latex, dome-shaped barrier that must be fitted by a health care provider. It is inserted into the vagina, along with a spermicidal jelly. It is inserted before intercourse. The diaphragm should be left in the vagina for 6 to 8 hours after intercourse.  Cervical cap. This is a round, soft, latex or plastic cup that fits over the cervix and must be fitted by a health care provider. The cap can be left in place for up to 48 hours after intercourse.  Sponge. This is a soft, circular piece of polyurethane foam. The sponge has spermicide in it. It is inserted into the vagina after  wetting it and before sexual intercourse.  Spermicides. These are chemicals that kill or block sperm from entering the cervix and uterus. They come in the form of creams, jellies, suppositories, foam, or tablets. They do not require a prescription. They are inserted into the vagina with an applicator before having sexual intercourse. The process must be repeated every time you have sexual intercourse. INTRAUTERINE CONTRACEPTION  Intrauterine device (IUD). This is a T-shaped device that is put in a woman's uterus during a menstrual period to prevent pregnancy. There are 2 types:  Copper IUD. This type of IUD is wrapped in copper wire and is placed inside the uterus. Copper makes the uterus and fallopian tubes produce a fluid that kills sperm. It can stay in place for 10 years.  Hormone IUD. This type of IUD contains the hormone progestin (synthetic progesterone). The hormone thickens the cervical mucus and prevents sperm from entering the uterus, and it also thins the uterine lining to prevent implantation of a fertilized egg. The hormone can weaken or kill the sperm that get into the uterus. It can stay in place for 3-5 years, depending on which type of IUD is used. PERMANENT METHODS OF CONTRACEPTION  Female tubal ligation. This is when the woman's fallopian tubes are surgically sealed, tied, or blocked to prevent the egg from traveling to the uterus.  Hysteroscopic sterilization. This involves placing a small coil or insert into each fallopian tube. Your doctor uses a technique called hysteroscopy to do the procedure. The device causes scar tissue to form. This results in permanent blockage of the fallopian tubes, so the sperm cannot fertilize the egg. It takes about 3 months after the procedure for the tubes to become blocked. You must use another form of birth control for these 3 months.  Female sterilization. This is when the female has the tubes that carry sperm tied off (vasectomy).This blocks  sperm from entering the vagina during sexual intercourse. After the procedure, the man can still ejaculate fluid (semen). NATURAL PLANNING METHODS  Natural family planning. This is not having sexual intercourse or using a barrier method (condom, diaphragm, cervical cap) on days the woman could become pregnant.  Calendar method. This is keeping track of the length of each menstrual cycle and identifying when you are fertile.  Ovulation method. This is avoiding sexual intercourse during ovulation.  Symptothermal method. This is avoiding sexual intercourse during ovulation, using a thermometer and ovulation symptoms.  Post-ovulation method. This is timing sexual intercourse after you have ovulated. Regardless of which type or method of contraception you choose, it is important that you use condoms to protect against the transmission of sexually transmitted infections (STIs). Talk with your health care provider about which form of contraception is most appropriate for you.   This information is  not intended to replace advice given to you by your health care provider. Make sure you discuss any questions you have with your health care provider.   Document Released: 11/16/2005 Document Revised: 11/21/2013 Document Reviewed: 05/11/2013 Elsevier Interactive Patient Education [employer]

## 2015-12-31 NOTE — Discharge Summary (Signed)
OB Discharge Summary     Patient Name: Claudia Lucas DOB: 1996-08-22 MRN: [id number]  Date of admission: 12/28/2015 Delivering MD: Elsie Lincoln H   Date of discharge: 12/31/2015  Admitting diagnosis: PREG, SICK, WANT TO SEE DOCTOR Intrauterine pregnancy: [redacted]w[redacted]d     Secondary diagnosis:  Principal Problem:   S/P primary low transverse C-section Active Problems:   Breech malpresentation successfully converted to cephalic presentation   Active labor at term   Abnormal O'Sullivan glucose challenge test, antepartum  Additional problems:  Patient is a refugee from [place]. Patient had OB care in [place] and initiated care at [facility] at [redacted]w[redacted]d.  Breech malposition successfully converted to cephalic 1 hr glucola failed but passed 3 hr GBS negative     Discharge diagnosis: Term Pregnancy Delivered                                                                                                Post partum procedures:none  Augmentation: Pitocin  Complications: failure to progress > C/S delivery   Hospital course:  Onset of Labor With Unplanned C/S  20 y.o. yo G1P1001 at [redacted]w[redacted]d was admitted in Latent Labor on 12/28/2015. Patient had a labor course significant for variable decelerations, improved with amnioinfusion. Membrane Rupture Time/Date: 9:35 AM ,12/29/2015  Pt FD and pushing on and off for 3 hours. Pt had to stop several times due to fetal decelerations. No progress past 2 hours. Caput at +2, head at +1. Tight introitus and vacuum not good option.  The patient went for cesarean section due to Arrest of Descent, and delivered a Viable infant,12/29/2015  Details of operation can be found in separate operative note. Patient had an uncomplicated postpartum course.  She is ambulating,tolerating a regular diet, passing flatus, and urinating well.  Patient is discharged home in stable condition 12/31/2015.  Physical exam  Filed Vitals:   12/30/15 0637 12/30/15 1000 12/30/15 1752 12/31/15 0605   BP:  95/57 102/55 84/64  Pulse:  101 89 82  Temp:  98 F (36.7 C) 98.1 F (36.7 C) 98.3 F (36.8 C)  TempSrc:  Oral Oral Oral  Resp:  Height:      Weight:      SpO2: 93% 99% 100%    General: alert, cooperative and no distress Lochia: appropriate Uterine Fundus: firm Incision: Healing well with no significant drainage, No significant erythema DVT Evaluation: No evidence of DVT seen on physical exam. Labs: Lab Results  Component Value Date   WBC 12.8* 12/30/2015   HGB 9.9* 12/30/2015   HCT 29.5* 12/30/2015   MCV 93.1 12/30/2015   PLT 185 12/30/2015   No flowsheet data found.  Discharge instruction: per After Visit Summary and "Baby and Me Booklet".  After visit meds:    Medication List    TAKE these medications        acetaminophen 325 MG tablet  Commonly known as:  TYLENOL  Take 2 tablets (650 mg total) by mouth every 4 (four) hours as needed (for pain scale < 4).     ibuprofen 600 MG tablet  Commonly  known as:  ADVIL,MOTRIN  Take 1 tablet (600 mg total) by mouth every 6 (six) hours.     oxyCODONE-acetaminophen 5-325 MG tablet  Commonly known as:  PERCOCET/ROXICET  Take 1 tablet by mouth every 4 (four) hours as needed for severe pain.     Prenatal Vitamins 28-0.8 MG Tabs  Take 1 tablet by mouth daily.     senna-docusate 8.6-50 MG tablet  Commonly known as:  Senokot-S  Take 2 tablets by mouth at bedtime as needed for mild constipation.        Diet: routine diet  Activity: Advance as tolerated. Pelvic rest for 6 weeks.   Outpatient follow up:6 weeks Follow up Appt: Future Appointments Date Time Provider Department Center  02/05/2016 2:15 PM Catalina Antigua, MD [facility]   Follow up Visit: Follow-up Information    Follow up with [facility]. Schedule an appointment as soon as possible for a visit in 6 weeks.   Specialty:  Obstetrics and Gynecology   Why:  post partum check   Contact information:   [address] [place] [address] [phone]       Postpartum contraception: Undecided and provided handout after discussion  Newborn Data: Live born female  Birth Weight: 6 lb 4.2 oz (2840 g) APGAR: 7, 8  Baby Feeding: Breast Disposition:baby having fevers - dispo per peds   12/31/2015 Federico Flake, MD

## 2015-12-31 NOTE — Progress Notes (Signed)
Interpreter called on phone.  Meals ordered for pt.  Lunch, dinner, and breakfast.  Pt.  Refuses pain meds at this time.  Pt. Went to NICU to see Kandis Mannan, baby.  She walked to NICU and walked back withour issues.

## 2015-12-31 NOTE — Progress Notes (Signed)
Attempted to use language line to introduce self to patient, assess pain and explain newborn assessment, however language line would not work.  Attempted 3 times and the line would not connect to an interpreter.

## 2015-12-31 NOTE — Lactation Note (Signed)
This note was copied from the chart of Claudia Cynithia Salois. Lactation Consultation Note  Language line used with Arabic interpreter.  Mom instructed to pump every hours for 15-20 minutes.  Mom has pump in room but requests assist with pumping.  I assisted mom with pumping and she obtained 50 mls.  Milk transported to NICU.  Instructed to pump again in 3 hours.  WIC pump referral faxed.  Patient Name: Claudia Lucas Date: 12/31/2015     Maternal Data    Feeding Feeding Type: Formula Nipple Type: Slow - flow Length of feed: 15 min  LATCH Score/Interventions                      Lactation Tools Discussed/Used     Consult Status      Huston Foley 12/31/2015, 3:50 PM

## 2016-01-01 ENCOUNTER — Inpatient Hospital Stay (HOSPITAL_COMMUNITY): Admission: RE | Admit: 2016-01-01 | Payer: Medicaid Other | Source: Ambulatory Visit

## 2016-01-01 NOTE — Progress Notes (Signed)
Interpretor used to discuss plan of care and discussed breast pump options.

## 2016-01-01 NOTE — Discharge Summary (Signed)
OB Discharge Summary     Patient Name: Claudia Lucas DOB: December 14, 1995 MRN: [id number]  Date of admission: 12/28/2015 Delivering MD: Elsie Lincoln H   Date of discharge: 01/01/2016  Admitting diagnosis: PREG, SICK, WANT TO SEE DOCTOR Intrauterine pregnancy: [redacted]w[redacted]d     Secondary diagnosis:  Principal Problem:   S/P primary low transverse C-section Active Problems:   Breech malpresentation successfully converted to cephalic presentation   Active labor at term   Abnormal O'Sullivan glucose challenge test, antepartum  Additional problems:  Patient is a refugee from [place]. Patient had OB care in [place] and initiated care at [facility] at [redacted]w[redacted]d.  Breech malposition successfully converted to cephalic 1 hr glucola failed but passed 3 hr GBS negative     Discharge diagnosis: Term Pregnancy Delivered                                                                                                Post partum procedures:none  Augmentation: Pitocin  Complications: failure to progress > C/S delivery   Hospital course:  Onset of Labor With Unplanned C/S  20 y.o. yo G1P1001 at [redacted]w[redacted]d was admitted in Latent Labor on 12/28/2015. Patient had a labor course significant for variable decelerations, improved with amnioinfusion. Membrane Rupture Time/Date: 9:35 AM ,12/29/2015  Pt FD and pushing on and off for 3 hours. Pt had to stop several times due to fetal decelerations. No progress past 2 hours. Caput at +2, head at +1. Tight introitus and vacuum not good option.  The patient went for cesarean section due to Arrest of Descent, and delivered a Viable infant,12/29/2015  Details of operation can be found in separate operative note. Patient had an uncomplicated postpartum course.  She is ambulating,tolerating a regular diet, passing flatus, and urinating well.  Patient is discharged home in stable condition 01/01/2016.  Physical exam  Filed Vitals:   12/30/15 1752 12/31/15 0605 12/31/15 1843 01/01/16 0557   BP: 102/55 84/64 116/66 104/63  Pulse: 89 82 78   Temp: 98.1 F (36.7 C) 98.3 F (36.8 C) 98.6 F (37 C) 97.9 F (36.6 C)  TempSrc: Oral Oral Oral Oral  Resp: Height:      Weight:      SpO2: 100%   100%   General: alert, cooperative and no distress Lochia: appropriate Uterine Fundus: firm Incision: Healing well with no significant drainage, No significant erythema DVT Evaluation: No evidence of DVT seen on physical exam. Labs: Lab Results  Component Value Date   WBC 12.8* 12/30/2015   HGB 9.9* 12/30/2015   HCT 29.5* 12/30/2015   MCV 93.1 12/30/2015   PLT 185 12/30/2015   No flowsheet data found.  Discharge instruction: per After Visit Summary and "Baby and Me Booklet".  After visit meds:    Medication List    TAKE these medications        acetaminophen 325 MG tablet  Commonly known as:  TYLENOL  Take 2 tablets (650 mg total) by mouth every 4 (four) hours as needed (for pain scale < 4).     ibuprofen 600 MG  tablet  Commonly known as:  ADVIL,MOTRIN  Take 1 tablet (600 mg total) by mouth every 6 (six) hours.     oxyCODONE-acetaminophen 5-325 MG tablet  Commonly known as:  PERCOCET/ROXICET  Take 1 tablet by mouth every 4 (four) hours as needed for severe pain.     Prenatal Vitamins 28-0.8 MG Tabs  Take 1 tablet by mouth daily.     senna-docusate 8.6-50 MG tablet  Commonly known as:  Senokot-S  Take 2 tablets by mouth at bedtime as needed for mild constipation.        Diet: routine diet  Activity: Advance as tolerated. Pelvic rest for 6 weeks.   Outpatient follow up:6 weeks Follow up Appt: Future Appointments Date Time Provider Department Center  02/05/2016 2:15 PM Catalina Antigua, MD [facility]   Follow up Visit: Follow-up Information    Follow up with [facility]. Schedule an appointment as soon as possible for a visit in 6 weeks.   Specialty:  Obstetrics and Gynecology   Why:  post partum check   Contact information:    [address] [place] [address] [phone]       Postpartum contraception: Undecided and provided handout after discussion  Newborn Data: Live born female  Birth Weight: 6 lb 4.2 oz (2840 g) APGAR: 7, 8  Baby Feeding: Breast Disposition:baby having fevers - dispo per peds   01/01/2016 Wynne Dust, MD   OB FELLOW DISCHARGE ATTESTATION  I have seen and examined this patient and agree with above documentation in the resident's note.   Silvano Bilis, MD 7:16 AM

## 2016-01-01 NOTE — Lactation Note (Signed)
This note was copied from the chart of Boy Aerin Hitson. Lactation Consultation Note  Patient Name: Boy TERILYN SANO ONGEX'B Date: 01/01/2016 Reason for consult: Follow-up assessment;NICU baby NICU baby 66 hours old. Assisted by [employer] "Lila" [phone] by phone line. Assisted mom to make an appointment with [facility] office for 1400 in order to obtain a DEBP. Mom states pumping is going well, and she has over an ounce at the bedside that she states that she is about to take to NICU. Discussed EBM storage and transportation, engorgement prevention/treatment, and mom aware of OP/BFSG and LC phone line assistance after D/C. Enc mom to get labels from NICU nurse when she takes EBM, and to put date and time of pumping on each bottle. Mom given a manual pump and instructions reviewed. Mom also given bottles with lids. Mom states that she had baby at breast several times before baby taken to NICU, enc mom to offer STS and attempts at breasts as baby able.   Maternal Data    Feeding    LATCH Score/Interventions                      Lactation Tools Discussed/Used     Consult Status Consult Status: PRN    Geralynn Ochs 01/01/2016, 10:12 AM

## 2016-01-01 NOTE — Progress Notes (Signed)
Interpretor used to do discharge teaching and to set up [facility] appointment. Interpretor stated the pt told her she understood all instructions and all questions answered.

## 2016-01-01 NOTE — Progress Notes (Signed)
CLINICAL SOCIAL WORK MATERNAL/CHILD NOTE  Patient Details  Name: Claudia Lucas MRN: [id number] Date of Birth: 2018-03-30  Date:  01/01/2016  Clinical Social Worker Initiating Note:  Azelia Reiger E. Brigitte Pulse, [place] Date/ Time Initiated:  01/01/16/1000     Child's Name:  Claudia Lucas   Legal Guardian:   (Parents: Claudia Lucas and Claudia Lucas)   Need for Interpreter:  Arabic   Date of Referral:   (t-1)     Reason for Referral:  Other (Comment) (Language barrier, lack of resources.)   Referral Source:  RN   Address:  [address] [place], [place] [address]  Phone number:  [phone]   Household Members:  Spouse   Natural Supports (not living in the home):  Neighbors, [employer], Immediate Family, Extended Family (Parents report that their neighbor and a friend are their local support system.  They also report that the organization that helped them seek refuge here is supportive.  Their family is supportive, but remain in [place].)   Professional Supports: Other (Comment) Comptroller)   Employment:  (FOB is looking for work currently.)   Type of Work:     Education:      Pensions consultant:  [employer]   Other Resources:  [facility]   Cultural/Religious Considerations Which May Impact Care: None stated.  Strengths:  Ability to meet basic needs , Pediatrician chosen , Compliance with medical plan , Understanding of illness, Home prepared for child  (Pediatric follow up will be at [facility].)   Risk Factors/Current Problems:  None   Cognitive State:  Alert , Able to Concentrate , Linear Thinking    Mood/Affect:  Interested , Comfortable , Relaxed    CSW Assessment: CSW met with parents in MOB's first floor room/102 with assistance from Arabic interpreter to introduce services, offer support, and complete assessment due to baby's admission to NICU and staff's concern for lack of resources.  Parents were very pleasant and welcoming of CSW's visit.   MOB is preparing for her  own discharge this morning and states she has felt nervous about baby's admission to NICU, but also that she feels she has a good understanding of his medical needs at this time.  FOB agrees.  They report that baby is having lab work completed and his discharge will depend on the results.  They feel well informed by NICU staff.  CSW validated feelings of nervousness and uncertainty and spoke about the unnatural nature of being separated from the baby.  CSW encouraged them to focus on baby and take this experience on day at a time.  CSW also encouraged them to allow themselves to be emotional, as this is an emotional time.  CSW provided education regarding perinatal mood disorders and informed parents of the importance to speak with a medical professional if they have concerns about them mental health at any time.  CSW spoke about common emotions often experienced after the birth of a baby for their awareness.  Parents state no questions or current emotional concerns.   CSW discussed resources and preparations for baby at home.  Parents report that they have all necessary baby supplies including a crib for baby to sleep in.  CSW provided SIDS education, as they were not aware of this.  They stated understanding.  Parents state that they have a neighbor who can transport them to the hospital to see baby after MOB's discharge today.  MOB receives Medicaid, Physicist, medical and WIC.  CSW inquired about the ongoing support they have  through the agency that helped them get to the [place].  FOB states they can call for assistance if needed, but could not recall the name of the agency.  CSW provided them with the contact information for the [facility] [employer] as an additional support resource.  Parents were appreciative.  They state they have been here from [place] for 3 months and really like it here.  MOB smiled as she talked about this.  They report that their families are in [place], but that they are in contact  regularly.   CSW explained ongoing support services offered by NICU CSW and gave contact information.  Parents stated appreciation and no needs at this time.   CSW Plan/Description:  Engineer, mining , Information/Referral to [employer] , Psychosocial Support and Ongoing Assessment of Needs    Alphonzo Cruise, [place] 01/01/2016, 1:05 PM

## 2016-01-02 ENCOUNTER — Ambulatory Visit: Payer: Self-pay

## 2016-01-02 NOTE — Lactation Note (Addendum)
This note was copied from the chart of Claudia Genelda Kesinger. Lactation Consultation Note  Patient Name: Claudia Lucas Date: 01/02/2016 Reason for consult: Follow-up assessment;NICU baby NICU baby 61 days old. Called to assist with latching baby in NICU. Mom had requested to put baby to breast through [facility] interpreters with baby's nurse Morrie Sheldon, RN. Positioned mom in chair with pillows and blankets and assisted mom to latch baby in football position to right breast. Mom's breast is dripping milk when uncovered. Baby not able to latch to breast with several attempts because mom's nipple is flat. Fitted mom with a #16 NS and placed a rolled cloth diaper under mom's large/heavy breast and baby latched immediately. Baby maintained a deep latch, suckling rhythmically with intermittent swallows noted. Colostrum noted in NS as well. Raised mom's feet with chair and placed rolled blanket under mom's hand and baby's head. Mom smiled and said yes. Left mom to enjoy nursed baby. Discussed with Morrie Sheldon, RN that additional teaching with Arabic interpreter is needed regarding NS use at a later time--prior to discharge--in order not to interrupt nursing at this time.  Maternal Data    Feeding Feeding Type: Breast Fed Nipple Type: Slow - flow Length of feed:  (LC assessed first 10 minutes of BF. )  LATCH Score/Interventions Latch: Grasps breast easily, tongue down, lips flanged, rhythmical sucking. Intervention(s): Skin to skin Intervention(s): Adjust position;Assist with latch;Breast compression  Audible Swallowing: Spontaneous and intermittent  Type of Nipple: Flat  Comfort (Breast/Nipple): Soft / non-tender     Hold (Positioning): Assistance needed to correctly position infant at breast and maintain latch. Intervention(s): Support Pillows;Position options  LATCH Score: 8  Lactation Tools Discussed/Used Nipple shield size: 16   Consult Status Consult Status: PRN    Geralynn Ochs 01/02/2016, 6:13 PM

## 2016-01-03 ENCOUNTER — Ambulatory Visit: Payer: Self-pay

## 2016-01-03 NOTE — Lactation Note (Signed)
This note was copied from the chart of Claudia Arron Piedra. Lactation Consultation Note  Patient Name: Claudia Lucas Date: 01/03/2016 Reason for consult: Follow-up assessment;NICU baby  NICU baby 63 days old, scheduled for D/C today. Assisted by [facility] "Fadya" [phone] and "Aleda Grana" [phone] (phone battery stopped working during first call).  Discussed with mom that NS a temporary device until baby hopefully able to nurse directly from right breast. Mom's right nipple is flat, but mom is able to latch baby to left breast without NS. Enc attempting to latch directly to right breast without shield at each feeding first, and then using shield as needed. Discussed with mom that baby's weight gain will need to be followed carefully, and that she will need to continue some pumping on right breast as long as she uses the shield in order to protect her breast milk supply. Enc pumping if baby not able to drain right breast well--since mom has a lot of milk and continues to drip in between nursing. Discussed how to make sure baby satisfied at breast and transferring enough. Mom aware of OP/BFSG and LC phone line assistance after D/C.  Patient's nurse Nicki Guadalajara, RN, will call for [facility] assistance when baby wakes for next feeding in order to make sure mom able to apply NS and nurse baby on her own in preparation for D/C.  Maternal Data    Feeding    LATCH Score/Interventions                      Lactation Tools Discussed/Used     Consult Status Consult Status: PRN    Geralynn Ochs 01/03/2016, 11:44 AM

## 2016-01-03 NOTE — Lactation Note (Signed)
This note was copied from the chart of Boy Keidra Uhrich. Lactation Consultation Note  Patient Name: Boy DAAIYAH BAUMERT JYNWG'N Date: 01/03/2016 Reason for consult: Follow-up assessment;NICU baby  NICU baby 32 days old. Mom able to latch baby to right breast in football position using #16 NS. Mom only needed reminder of how to apply NS. Mom much more comfortable latching baby today as opposed to last night. Baby able to transfer milk through NS, which was full of transitional milk.   Maternal Data    Feeding Feeding Type: Breast Fed (LC assessed first 10 minutes of BF.)  LATCH Score/Interventions Latch: Grasps breast easily, tongue down, lips flanged, rhythmical sucking.  Audible Swallowing: Spontaneous and intermittent  Type of Nipple: Flat  Comfort (Breast/Nipple): Soft / non-tender     Hold (Positioning): Assistance needed to correctly position infant at breast and maintain latch. Intervention(s): Support Pillows  LATCH Score: 8  Lactation Tools Discussed/Used Nipple shield size: 16   Consult Status Consult Status: PRN    Nancy Nordmann, Alexys Lobello 01/03/2016, 1:35 PM

## 2016-02-05 ENCOUNTER — Ambulatory Visit: Payer: Medicaid Other | Admitting: Obstetrics and Gynecology

## 2016-02-21 ENCOUNTER — Ambulatory Visit: Payer: Medicaid Other | Admitting: Obstetrics & Gynecology

## 2016-03-20 ENCOUNTER — Encounter: Payer: Self-pay | Admitting: Obstetrics & Gynecology

## 2016-03-20 ENCOUNTER — Ambulatory Visit (INDEPENDENT_AMBULATORY_CARE_PROVIDER_SITE_OTHER): Payer: Medicaid Other | Admitting: Obstetrics & Gynecology

## 2016-03-20 VITALS — BP 109/69 | HR 84 | Wt 132.0 lb

## 2016-03-20 DIAGNOSIS — Z30011 Encounter for initial prescription of contraceptive pills: Secondary | ICD-10-CM

## 2016-03-20 MED ORDER — NORGESTIMATE-ETH ESTRADIOL 0.25-35 MG-MCG PO TABS
1.0000 | ORAL_TABLET | Freq: Every day | ORAL | Status: DC
Start: 2016-03-20 — End: 2017-06-14

## 2016-03-20 NOTE — Progress Notes (Signed)
Subjective:     Claudia Lucas is a 20 y.o. female who presents for a postpartum visit. She is 11 weeks postpartum following a low cervical transverse Cesarean section. I have fully reviewed the prenatal and intrapartum course. The delivery was at 593w5d gestational weeks. Outcome: primary cesarean section, low transverse incision. Anesthesia: spinal. Postpartum course has been uncomplicated. Baby's course has been unremarkable. Baby is feeding by breast. Bleeding no bleeding. Bowel function is normal. Bladder function is normal. Patient is sexually active. Contraception method is none. Postpartum depression screening: negative.  The following portions of the patient's history were reviewed and updated as appropriate: allergies, current medications, past family history, past medical history, past social history, past surgical history and problem list.  Review of Systems Pertinent items are noted in HPI.   Objective:    BP 109/69 mmHg  Pulse 84  Wt 132 lb (59.875 kg)  Breastfeeding? Yes       Abd: soft, NT, ND Incision: well healed; clean and dry  Assessment:     11 weeks postpartum exam. Pap smear not done at today's visit.   Plan:    1. Contraception: OCP (estrogen/progesterone)- pt wanted combined OCPs as she si NOT breastfeeding exclusively 2. Follow up in: 1 year or as needed.    [place] interpreter with video [phone]  [name], M.D., Evern CoreFACOG

## 2016-03-20 NOTE — Patient Instructions (Signed)

## 2016-08-05 ENCOUNTER — Encounter (HOSPITAL_COMMUNITY): Payer: Self-pay | Admitting: Emergency Medicine

## 2016-08-05 ENCOUNTER — Emergency Department (HOSPITAL_COMMUNITY)
Admission: EM | Admit: 2016-08-05 | Discharge: 2016-08-05 | Disposition: A | Discharge status: Home / Self Care | Payer: Medicaid Other | Attending: Physician Assistant | Admitting: Physician Assistant

## 2016-08-05 DIAGNOSIS — B9789 Other viral agents as the cause of diseases classified elsewhere: Secondary | ICD-10-CM

## 2016-08-05 DIAGNOSIS — R0981 Nasal congestion: Secondary | ICD-10-CM | POA: Diagnosis present

## 2016-08-05 DIAGNOSIS — J069 Acute upper respiratory infection, unspecified: Secondary | ICD-10-CM | POA: Diagnosis not present

## 2016-08-05 DIAGNOSIS — R05 Cough: Secondary | ICD-10-CM | POA: Diagnosis not present

## 2016-08-05 MED ORDER — BENZONATATE 100 MG PO CAPS: 100.0000 mg | ORAL_CAPSULE | Freq: Three times a day (TID) | ORAL | 0 refills | Status: DC

## 2016-08-05 NOTE — ED Triage Notes (Signed)
Pt here with cough and nasal congestion x several days

## 2016-08-05 NOTE — ED Provider Notes (Signed)
[facility]-EMERGENCY DEPT Provider Note   CSN: [id number] Arrival date & time: 08/05/16  [id number]   By signing my name below, I, Claudia Lucas, attest that this documentation has been prepared under the direction and in the presence of Claudia LowerVrinda Turhan Chill, NP. Electronically Signed: Sonum Lucas, Neurosurgeoncribe. 08/05/16. 10:30 AM.   History   Chief Complaint Chief Complaint  Patient presents with  . Nasal Congestion  . Cough    The history is provided by the patient. No language interpreter was used.     HPI Comments: Claudia Lucas is a 20 y.o. female who presents to the Emergency Department complaining of 1 month of a persistent, unchanged cough with associated rhinorrhea, subjective fevers, and sore throat. She has not tried OTC medications for her symptoms. She has not been evaluated for her symptoms before today.  LNMP August 1st. She denies any known allergies. She denies taking daily medications aside from birth control pills.   Past Medical History:  Diagnosis Date  . Anemia   . Ovarian cyst     Patient Active Problem List   Diagnosis Date Noted  . Abnormal O'Sullivan glucose challenge test, antepartum 12/31/2015  . S/P primary low transverse C-section 12/31/2015  . Active labor at term 12/28/2015  . Breech malpresentation successfully converted to cephalic presentation 12/06/2015  . Supervision of low-risk first pregnancy 11/14/2015    Past Surgical History:  Procedure Laterality Date  . CESAREAN SECTION N/A 12/29/2015   Procedure: CESAREAN SECTION;  Surgeon: Lesly DukesKelly H Leggett, MD;  Location: [facility] OR[facility];  Service: Obstetrics;  Laterality: N/A;  . NO PAST SURGERIES      OB History    Gravida Para Term Preterm AB Living   1 1 1     1    SAB TAB Ectopic Multiple Live Births         0 1       Home Medications    Prior to Admission medications   Medication Sig Start Date End Date Taking? Authorizing Provider  acetaminophen (TYLENOL) 325 MG tablet Take 2 tablets (650 mg total) by mouth  every 4 (four) hours as needed (for pain scale < 4). Patient not taking: Reported on 03/20/2016 12/31/15   Wynne DustAmber Heckart, MD  ibuprofen (ADVIL,MOTRIN) 600 MG tablet Take 1 tablet (600 mg total) by mouth every 6 (six) hours. Patient not taking: Reported on 03/20/2016 12/31/15   Wynne DustAmber Heckart, MD  norgestimate-ethinyl estradiol (ORTHO-CYCLEN,SPRINTEC,PREVIFEM) 0.25-35 MG-MCG tablet Take 1 tablet by mouth daily. 03/20/16   Willodean Rosenthalarolyn Harraway-Smith, MD  oxyCODONE-acetaminophen (PERCOCET/ROXICET) 5-325 MG tablet Take 1 tablet by mouth every 4 (four) hours as needed for severe pain. Patient not taking: Reported on 03/20/2016 12/31/15   Wynne DustAmber Heckart, MD  Prenatal Vit-Fe Fumarate-FA (PRENATAL VITAMINS) 28-0.8 MG TABS Take 1 tablet by mouth daily. Patient not taking: Reported on 03/20/2016 11/06/15   Misty StanleyLisa A Leftwich-Kirby, CNM  senna-docusate (SENOKOT-S) 8.6-50 MG tablet Take 2 tablets by mouth at bedtime as needed for mild constipation. Patient not taking: Reported on 03/20/2016 12/31/15   Wynne DustAmber Heckart, MD    Family History Family History  Problem Relation Age of Onset  . Diabetes Mother   . Hypertension Mother   . Hypertension Maternal Aunt   . Diabetes Maternal Aunt   . Hypertension Maternal Uncle   . Diabetes Maternal Uncle   . Hypertension Maternal Grandmother   . Diabetes Maternal Grandmother   . Hypertension Maternal Grandfather   . Diabetes Maternal Grandfather   . Cancer Neg Hx   . Heart  disease Neg Hx   . Stroke Neg Hx     Social History Social History  Substance Use Topics  . Smoking status: Never Smoker  . Smokeless tobacco: Never Used  . Alcohol use No     Allergies   Review of patient's allergies indicates no known allergies.   Review of Systems Review of Systems  Constitutional: Positive for fever (subjective).  HENT: Positive for rhinorrhea and sore throat.   Respiratory: Positive for cough.   All other systems reviewed and are negative.    Physical Exam Updated  Vital Signs BP 113/71 (BP Location: Right Arm)   Pulse 92   Temp 97.9 F (36.6 C) (Oral)   Resp 18   SpO2 100%   Physical Exam  Constitutional: She is oriented to person, place, and time. She appears well-developed and well-nourished. No distress.  HENT:  Head: Normocephalic and atraumatic.  Right Ear: External ear normal.  Left Ear: External ear normal.  Eyes: Conjunctivae and EOM are normal.  Neck: Neck supple. No tracheal deviation present.  Cardiovascular: Normal rate.   Pulmonary/Chest: Effort normal and breath sounds normal. No respiratory distress.  Musculoskeletal: Normal range of motion.  Neurological: She is alert and oriented to person, place, and time.  Skin: Skin is warm and dry.  Psychiatric: She has a normal mood and affect. Her behavior is normal.  Nursing note and vitals reviewed.    ED Treatments / Results  DIAGNOSTIC STUDIES: Oxygen Saturation is 100% on RA, normal by my interpretation.    COORDINATION OF CARE: 10:32 AM Discussed treatment plan with pt at bedside and pt agreed to plan.    Labs (all labs ordered are listed, but only abnormal results are displayed) Labs Reviewed - No data to display  EKG  EKG Interpretation None       Radiology No results found.  Procedures Procedures (including critical care time)  Medications Ordered in ED Medications - No data to display   Initial Impression / Assessment and Plan / ED Course  I have reviewed the triage vital signs and the nursing notes.  Pertinent labs & imaging results that were available during my care of the patient were reviewed by me and considered in my medical decision making (see chart for details).  Clinical Course    Likely viral. Discussed symptomatic treatment with pt  Final Clinical Impressions(s) / ED Diagnoses   Final diagnoses:  None    New Prescriptions New Prescriptions   No medications on file   I personally performed the services described in this  documentation, which was scribed in my presence. The recorded information has been reviewed and is accurate.    Claudia Lower, NP 08/05/16 1051    Courteney Randall An, MD 08/07/16 1032

## 2016-09-22 IMAGING — US US MFM FETAL BPP W/O NON-STRESS
1 series · 11 of 11 positions shown · non-contrast
Comparison: none

[Series 1: us mfm fetal bpp w/o non-stress · 11 acquisitions, 11 frames shown]
[im 1/11]
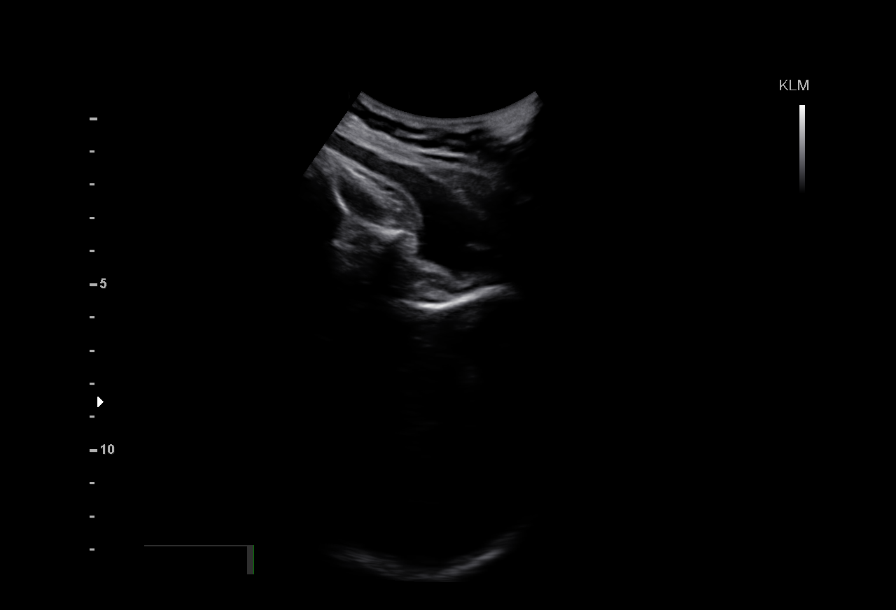
[im 2/11]
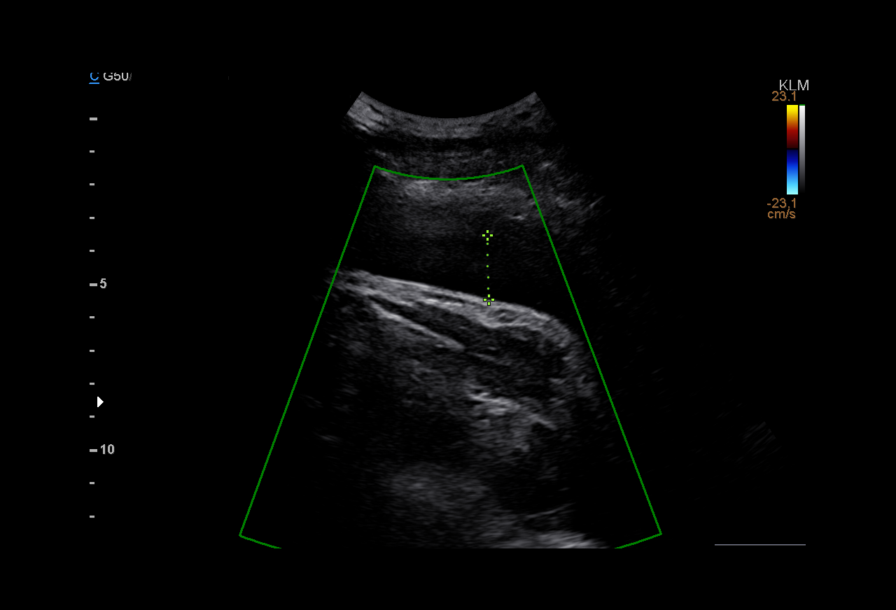
[im 3/11]
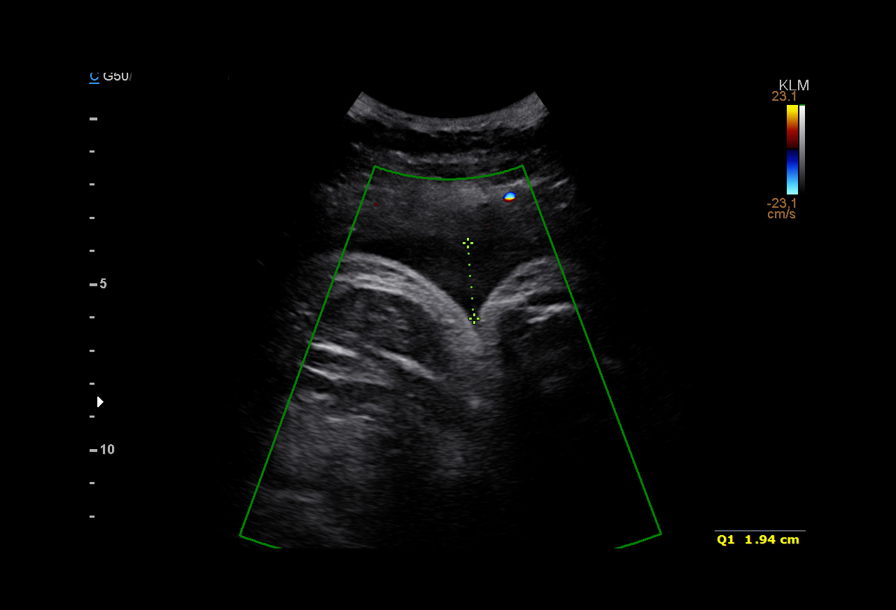
[im 4/11]
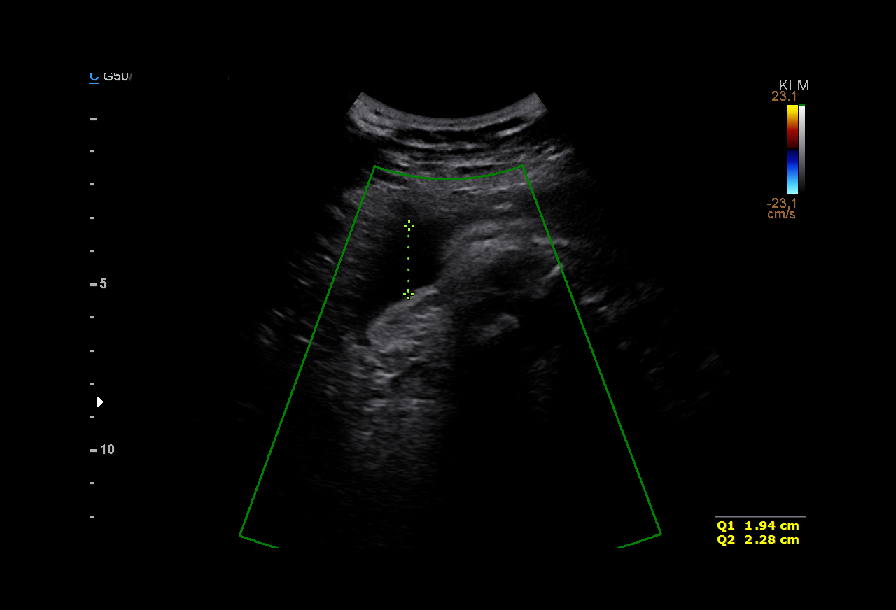
[im 5/11]
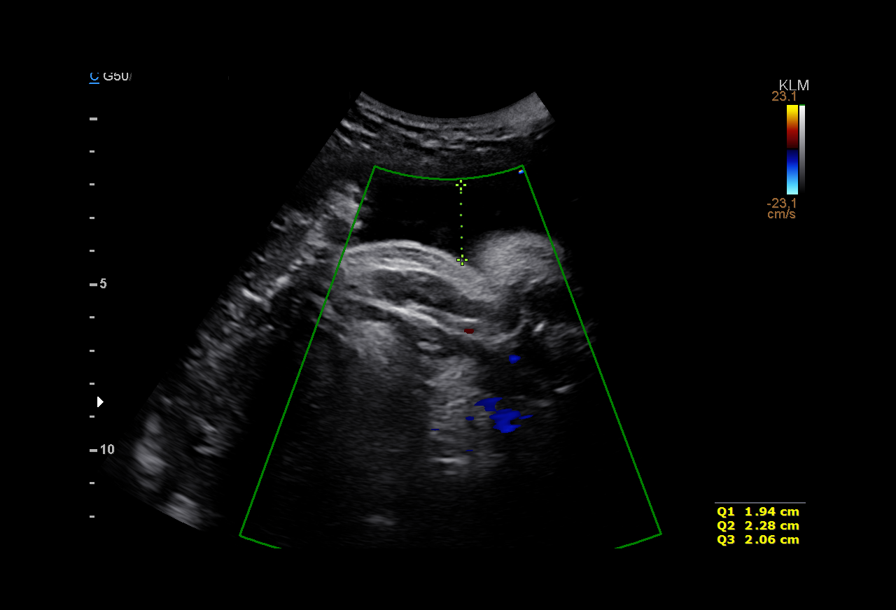
[im 6/11]
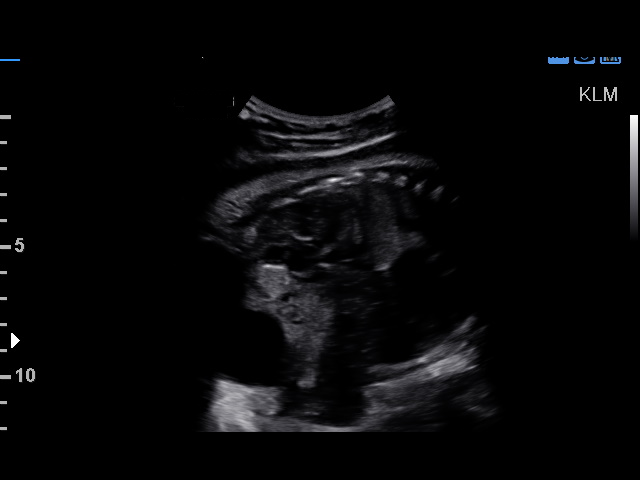
[im 7/11]
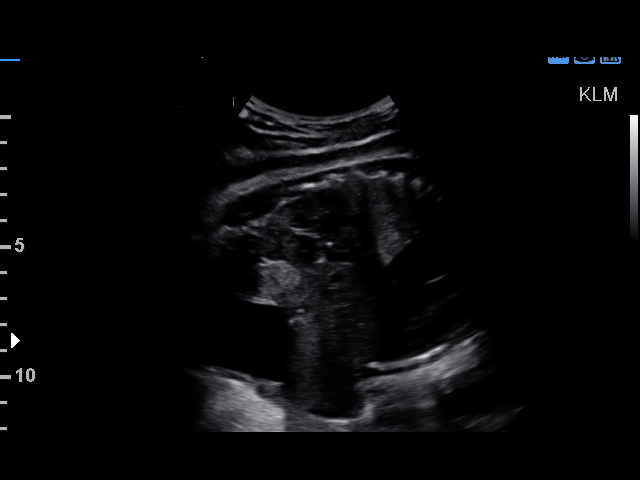
[im 8/11]
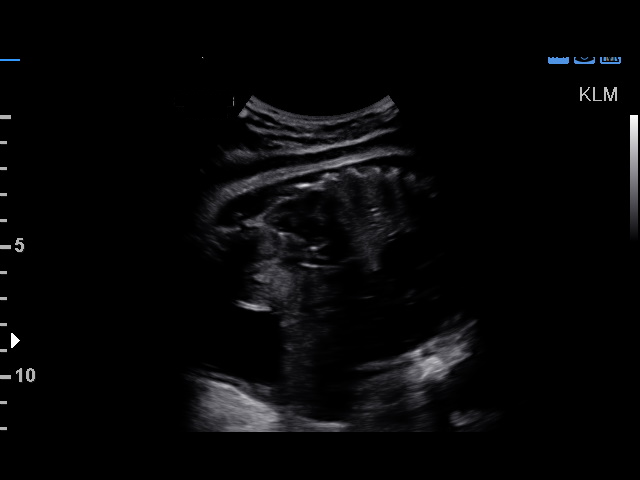
[im 9/11]
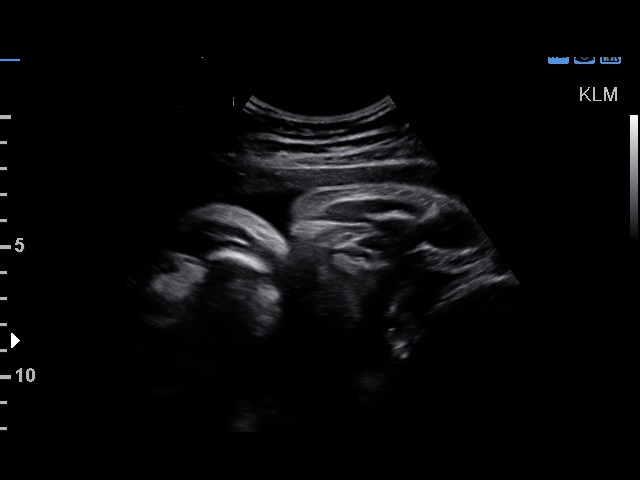
[im 10/11]
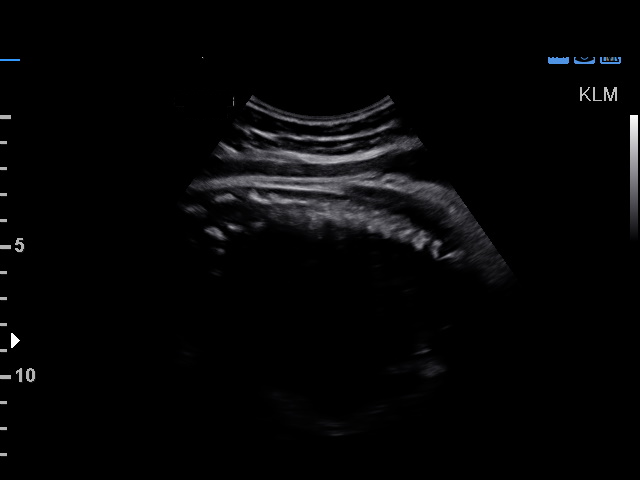
[im 11/11]
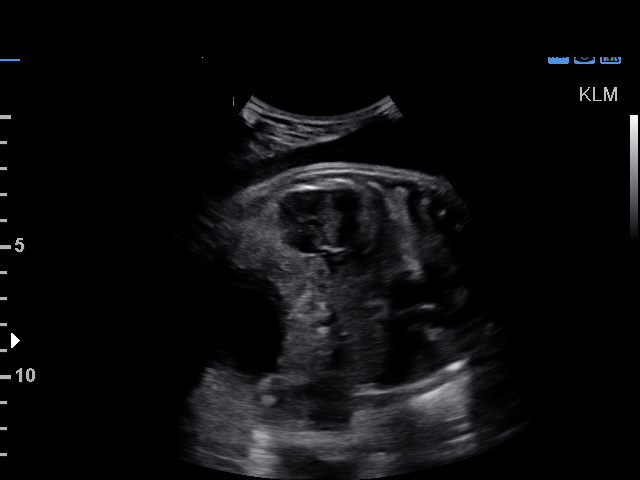

[11 of 11 positions shown; findings below may reference images not displayed]

MAU/Triage

1  SHONTA SCHUCK            [id number]      [id number]     [id number]
Indications

Non-reactive NST
40 weeks gestation of pregnancy
Late to prenatal care, third trimester
OB History

Gravidity:    1
Fetal Evaluation

Num Of Fetuses:     1
Fetal Heart         132
Rate(bpm):
Cardiac Activity:   Observed
Presentation:       Cephalic

Amniotic Fluid
AFI FV:      Subjectively within normal limits
AFI Sum:     8.53     cm      19  %Tile      Larg Pckt:   2.28  cm
RUQ:   1.94    cm   RLQ:    2.25   cm    LUQ:   2.28    cm    LLQ:   2.06    cm

Comment:    [DATE] BPP in 8 mins
Biophysical Evaluation

Amniotic F.V:   Within normal limits       F. Tone:         Observed
F. Movement:    Observed                   Score:           [DATE]
F. Breathing:   Observed
Gestational Age
Best:          40w 4d     Det. By:  Previous Ultrasound      EDD:    12/24/15
Impression

SIUP at 40+4 weeks
Cephalic presentation
Low normal amniotic fluid volume
BPP [DATE]
Recommendations

Follow-up as clinically indicated

## 2016-10-24 ENCOUNTER — Emergency Department (HOSPITAL_COMMUNITY): Payer: Medicaid Other

## 2016-10-24 ENCOUNTER — Encounter (HOSPITAL_COMMUNITY): Payer: Self-pay | Admitting: *Deleted

## 2016-10-24 ENCOUNTER — Emergency Department (HOSPITAL_COMMUNITY)
Admission: EM | Admit: 2016-10-24 | Discharge: 2016-10-25 | Disposition: A | Discharge status: Home / Self Care | Payer: Medicaid Other | Attending: Emergency Medicine | Admitting: Emergency Medicine

## 2016-10-24 DIAGNOSIS — R05 Cough: Secondary | ICD-10-CM | POA: Diagnosis present

## 2016-10-24 DIAGNOSIS — J069 Acute upper respiratory infection, unspecified: Secondary | ICD-10-CM | POA: Insufficient documentation

## 2016-10-24 MED ORDER — LORATADINE 10 MG PO TABS: 10.0000 mg | ORAL_TABLET | Freq: Every day | ORAL | 5 refills | Status: DC

## 2016-10-24 MED ORDER — ALBUTEROL SULFATE HFA 108 (90 BASE) MCG/ACT IN AERS: 2.0000 | INHALATION_SPRAY | RESPIRATORY_TRACT | 2 refills | Status: DC | PRN

## 2016-10-24 MED ORDER — FLUTICASONE PROPIONATE 50 MCG/ACT NA SUSP: 1.0000 | Freq: Every day | NASAL | 2 refills | Status: DC

## 2016-10-24 MED ORDER — PREDNISONE 20 MG PO TABS: 40.0000 mg | ORAL_TABLET | Freq: Every day | ORAL | 0 refills | Status: DC

## 2016-10-24 NOTE — ED Triage Notes (Addendum)
PT here with family and using the interpreter video service:  Pt is here with complaints of cough, runny nose, and shortness of breath times one month. No chest pain, no fever. No been out of country.  Pt also complains of itchy eyes and ears

## 2016-10-24 NOTE — ED Provider Notes (Signed)
[facility]-EMERGENCY DEPT Provider Note   CSN: [id number] Arrival date & time: 10/24/16  2209  By signing my name below, I, Suzan SlickAshley N. Elon SpannerLeger, attest that this documentation has been prepared under the direction and in the presence of Gilda Creasehristopher J Shamaine Mulkern, MD.  Electronically Signed: Suzan SlickAshley N. Elon SpannerLeger, ED Scribe. 10/24/16. 11:31 PM.   History   Chief Complaint Chief Complaint  Patient presents with  . Cough   The history is provided by the patient. A language interpreter was used ([id number]).    HPI Comments: Claudia Lucas is a 20 y.o. female without any pertinent past medical history who presents to the Emergency Department complaining of intermittent, worsening cough with associated rhinorrhea  x 1 month. She also reports itching to eyes and ears bilaterally along with shortness of breath. No recent fever, chills, or chest pain. No recent long distance travel. Pt states she was evaluated a few months ago in the Emergency Department for same. At that time, pt was prescribed a medication but states it did not help her.  PCP: No primary care provider on file.    Past Medical History:  Diagnosis Date  . Anemia   . Ovarian cyst     Patient Active Problem List   Diagnosis Date Noted  . Abnormal O'Sullivan glucose challenge test, antepartum 12/31/2015  . S/P primary low transverse C-section 12/31/2015  . Active labor at term 12/28/2015  . Breech malpresentation successfully converted to cephalic presentation 12/06/2015  . Supervision of low-risk first pregnancy 11/14/2015    Past Surgical History:  Procedure Laterality Date  . CESAREAN SECTION N/A 12/29/2015   Procedure: CESAREAN SECTION;  Surgeon: Lesly DukesKelly H Leggett, MD;  Location: [facility];  Service: Obstetrics;  Laterality: N/A;  . NO PAST SURGERIES      OB History    Gravida Para Term Preterm AB Living   1 1 1     1    SAB TAB Ectopic Multiple Live Births         0 1       Home Medications    Prior to Admission medications     Medication Sig Start Date End Date Taking? Authorizing Provider  acetaminophen (TYLENOL) 325 MG tablet Take 2 tablets (650 mg total) by mouth every 4 (four) hours as needed (for pain scale < 4). Patient not taking: Reported on 03/20/2016 12/31/15   Wynne DustAmber Heckart, MD  albuterol (PROVENTIL HFA;VENTOLIN HFA) 108 (90 Base) MCG/ACT inhaler Inhale 2 puffs into the lungs every 4 (four) hours as needed for wheezing or shortness of breath. 10/24/16   Gilda Creasehristopher J Wei Poplaski, MD  benzonatate (TESSALON) 100 MG capsule Take 1 capsule (100 mg total) by mouth every 8 (eight) hours. 08/05/16   Teressa LowerVrinda Pickering, NP  fluticasone (FLONASE) 50 MCG/ACT nasal spray Place 1 spray into both nostrils daily. 10/24/16   Gilda Creasehristopher J Cregg Jutte, MD  ibuprofen (ADVIL,MOTRIN) 600 MG tablet Take 1 tablet (600 mg total) by mouth every 6 (six) hours. Patient not taking: Reported on 03/20/2016 12/31/15   Wynne DustAmber Heckart, MD  loratadine (CLARITIN) 10 MG tablet Take 1 tablet (10 mg total) by mouth daily. 10/24/16   Gilda Creasehristopher J Jadelin Eng, MD  norgestimate-ethinyl estradiol (ORTHO-CYCLEN,SPRINTEC,PREVIFEM) 0.25-35 MG-MCG tablet Take 1 tablet by mouth daily. 03/20/16   Willodean Rosenthalarolyn Harraway-Smith, MD  oxyCODONE-acetaminophen (PERCOCET/ROXICET) 5-325 MG tablet Take 1 tablet by mouth every 4 (four) hours as needed for severe pain. Patient not taking: Reported on 03/20/2016 12/31/15   Wynne DustAmber Heckart, MD  predniSONE (DELTASONE) 20 MG tablet Take 2  tablets (40 mg total) by mouth daily with breakfast. 10/24/16   Gilda Creasehristopher J Asier Desroches, MD  Prenatal Vit-Fe Fumarate-FA (PRENATAL VITAMINS) 28-0.8 MG TABS Take 1 tablet by mouth daily. Patient not taking: Reported on 03/20/2016 11/06/15   Misty StanleyLisa A Leftwich-Kirby, CNM  senna-docusate (SENOKOT-S) 8.6-50 MG tablet Take 2 tablets by mouth at bedtime as needed for mild constipation. Patient not taking: Reported on 03/20/2016 12/31/15   Wynne DustAmber Heckart, MD    Family History Family History  Problem Relation Age of Onset  .  Diabetes Mother   . Hypertension Mother   . Hypertension Maternal Grandmother   . Diabetes Maternal Grandmother   . Hypertension Maternal Grandfather   . Diabetes Maternal Grandfather   . Hypertension Maternal Aunt   . Diabetes Maternal Aunt   . Hypertension Maternal Uncle   . Diabetes Maternal Uncle   . Cancer Neg Hx   . Heart disease Neg Hx   . Stroke Neg Hx     Social History Social History  Substance Use Topics  . Smoking status: Never Smoker  . Smokeless tobacco: Never Used  . Alcohol use No     Allergies   Patient has no known allergies.   Review of Systems Review of Systems  Constitutional: Negative for chills and fever.  HENT: Positive for rhinorrhea.   Eyes: Positive for itching.  Respiratory: Positive for cough and shortness of breath.   Cardiovascular: Negative for chest pain.  All other systems reviewed and are negative.    Physical Exam Updated Vital Signs BP 98/68 (BP Location: Right Arm)   Pulse 88   Temp 98.2 F (36.8 C) (Oral)   Resp 18   Wt 124 lb 3.2 oz (56.3 kg)   SpO2 100%   BMI 24.26 kg/m   Physical Exam  Constitutional: She is oriented to person, place, and time. She appears well-developed and well-nourished. No distress.  HENT:  Head: Normocephalic and atraumatic.  Right Ear: Hearing normal.  Left Ear: Hearing normal.  Nose: Nose normal.  Mouth/Throat: Oropharynx is clear and moist and mucous membranes are normal.  Eyes: Conjunctivae and EOM are normal. Pupils are equal, round, and reactive to light.  Neck: Normal range of motion. Neck supple.  Cardiovascular: Regular rhythm, S1 normal and S2 normal.  Exam reveals no gallop and no friction rub.   No murmur heard. Pulmonary/Chest: Effort normal and breath sounds normal. No respiratory distress. She exhibits no tenderness.  Abdominal: Soft. Normal appearance and bowel sounds are normal. There is no hepatosplenomegaly. There is no tenderness. There is no rebound, no guarding, no  tenderness at McBurney's point and negative Murphy's sign. No hernia.  Musculoskeletal: Normal range of motion.  Neurological: She is alert and oriented to person, place, and time. She has normal strength. No cranial nerve deficit or sensory deficit. Coordination normal. GCS eye subscore is 4. GCS verbal subscore is 5. GCS motor subscore is 6.  Skin: Skin is warm, dry and intact. No rash noted. No cyanosis.  Psychiatric: She has a normal mood and affect. Her speech is normal and behavior is normal. Thought content normal.  Nursing note and vitals reviewed.    ED Treatments / Results   DIAGNOSTIC STUDIES: Oxygen Saturation is 100% on RA, Normal by my interpretation.    COORDINATION OF CARE: 11:31 PM- Will order CXR. Discussed treatment plan with pt at bedside and pt agreed to plan.     Labs (all labs ordered are listed, but only abnormal results are displayed) Labs Reviewed -  No data to display  EKG  EKG Interpretation None       Radiology Dg Chest 2 View  Result Date: 10/24/2016 CLINICAL DATA:  Patient with cough and runny nose. EXAM: CHEST  2 VIEW COMPARISON:  None. FINDINGS: Normal cardiac and mediastinal contours. Patient is rotated to the left. No consolidative pulmonary opacities. No pleural effusion or pneumothorax. Regional skeleton is unremarkable. IMPRESSION: No active cardiopulmonary disease. Electronically Signed   By: Annia Belt M.D.   On: 10/24/2016 23:25    Procedures Procedures (including critical care time)  Medications Ordered in ED Medications - No data to display   Initial Impression / Assessment and Plan / ED Course  I have reviewed the triage vital signs and the nursing notes.  Pertinent labs & imaging results that were available during my care of the patient were reviewed by me and considered in my medical decision making (see chart for details).  Clinical Course    Presents with complaints of itchy eyes, watering eyes, runny nose, cough and  shortness of breath that has been ongoing for 1 month. Chest x-ray clear.  Final Clinical Impressions(s) / ED Diagnoses   Final diagnoses:  Viral upper respiratory tract infection   I personally performed the services described in this documentation, which was scribed in my presence. The recorded information has been reviewed and is accurate.  New Prescriptions Discharge Medication List as of 10/24/2016 11:37 PM    START taking these medications   Details  albuterol (PROVENTIL HFA;VENTOLIN HFA) 108 (90 Base) MCG/ACT inhaler Inhale 2 puffs into the lungs every 4 (four) hours as needed for wheezing or shortness of breath., Starting Sat 10/24/2016, Print    fluticasone (FLONASE) 50 MCG/ACT nasal spray Place 1 spray into both nostrils daily., Starting Sat 10/24/2016, Print    loratadine (CLARITIN) 10 MG tablet Take 1 tablet (10 mg total) by mouth daily., Starting Sat 10/24/2016, Print    predniSONE (DELTASONE) 20 MG tablet Take 2 tablets (40 mg total) by mouth daily with breakfast., Starting Sat 10/24/2016, Print          Gilda Crease, MD 10/26/16 [phone]

## 2016-12-20 ENCOUNTER — Emergency Department (HOSPITAL_COMMUNITY)
Admission: EM | Admit: 2016-12-20 | Discharge: 2016-12-20 | Disposition: A | Discharge status: Home / Self Care | Payer: Medicaid Other | Attending: Physician Assistant | Admitting: Physician Assistant

## 2016-12-20 ENCOUNTER — Encounter (HOSPITAL_COMMUNITY): Payer: Self-pay | Admitting: *Deleted

## 2016-12-20 DIAGNOSIS — J309 Allergic rhinitis, unspecified: Secondary | ICD-10-CM | POA: Diagnosis not present

## 2016-12-20 DIAGNOSIS — R05 Cough: Secondary | ICD-10-CM | POA: Diagnosis present

## 2016-12-20 DIAGNOSIS — Z79899 Other long term (current) drug therapy: Secondary | ICD-10-CM | POA: Insufficient documentation

## 2016-12-20 DIAGNOSIS — B9789 Other viral agents as the cause of diseases classified elsewhere: Secondary | ICD-10-CM

## 2016-12-20 DIAGNOSIS — J069 Acute upper respiratory infection, unspecified: Secondary | ICD-10-CM

## 2016-12-20 MED ORDER — CETIRIZINE HCL 10 MG PO TABS: 10.0000 mg | ORAL_TABLET | Freq: Every day | ORAL | 1 refills | Status: DC

## 2016-12-20 MED ORDER — FLUTICASONE PROPIONATE 50 MCG/ACT NA SUSP: 2.0000 | Freq: Every day | NASAL | 0 refills | Status: DC

## 2016-12-20 MED ORDER — BENZONATATE 100 MG PO CAPS: 100.0000 mg | ORAL_CAPSULE | Freq: Three times a day (TID) | ORAL | 0 refills | Status: DC | PRN

## 2016-12-20 NOTE — ED Notes (Signed)
Patient only speaks Arabic

## 2016-12-20 NOTE — ED Provider Notes (Signed)
[facility]-EMERGENCY DEPT Provider Note   CSN: [id number] Arrival date & time: 12/20/16  1053    By signing my name below, I, Valentino Saxon, attest that this documentation has been prepared under the direction and in the presence of Everlene Farrier, [place]. Electronically Signed: Valentino Saxon, ED Scribe. 12/20/16. 1:12 PM.  History   Chief Complaint Chief Complaint  Patient presents with  . Cough   The history is provided by the patient. A language interpreter was used.   HPI Comments: Claudia Lucas is a 21 y.o. female who presents to the Emergency Department complaining of moderate, persistent, unchanged cough onset a couple weeks ago. Pt was last seen in ED on 10/24/16 for similar symptoms, and was told to f/u if symptoms worsened. She states she has finished her prescribed medications as directed with minimal relief. Pt reports associated postnasal drip, nasal congestion, and rhinorrhea. Pt notes she has a hx of sinus infections in the past. Denies fever, chills, CP, SOB, HA, rash.   Past Medical History:  Diagnosis Date  . Anemia   . Ovarian cyst     Patient Active Problem List   Diagnosis Date Noted  . Abnormal O'Sullivan glucose challenge test, antepartum 12/31/2015  . S/P primary low transverse C-section 12/31/2015  . Active labor at term 12/28/2015  . Breech malpresentation successfully converted to cephalic presentation 12/06/2015  . Supervision of low-risk first pregnancy 11/14/2015    Past Surgical History:  Procedure Laterality Date  . CESAREAN SECTION N/A 12/29/2015   Procedure: CESAREAN SECTION;  Surgeon: Lesly Dukes, MD;  Location: [facility];  Service: Obstetrics;  Laterality: N/A;  . NO PAST SURGERIES      OB History    Gravida Para Term Preterm AB Living   1 1 1     1    SAB TAB Ectopic Multiple Live Births         0 1       Home Medications    Prior to Admission medications   Medication Sig Start Date End Date Taking? Authorizing Provider    acetaminophen (TYLENOL) 325 MG tablet Take 2 tablets (650 mg total) by mouth every 4 (four) hours as needed (for pain scale < 4). Patient not taking: Reported on 03/20/2016 12/31/15   Wynne Dust, MD  albuterol (PROVENTIL HFA;VENTOLIN HFA) 108 (90 Base) MCG/ACT inhaler Inhale 2 puffs into the lungs every 4 (four) hours as needed for wheezing or shortness of breath. 10/24/16   Gilda Crease, MD  benzonatate (TESSALON) 100 MG capsule Take 1 capsule (100 mg total) by mouth 3 (three) times daily as needed for cough. 12/20/16   Everlene Farrier, PA-C  cetirizine (ZYRTEC ALLERGY) 10 MG tablet Take 1 tablet (10 mg total) by mouth daily. 12/20/16   Everlene Farrier, PA-C  fluticasone (FLONASE) 50 MCG/ACT nasal spray Place 2 sprays into both nostrils daily. 12/20/16   Everlene Farrier, PA-C  ibuprofen (ADVIL,MOTRIN) 600 MG tablet Take 1 tablet (600 mg total) by mouth every 6 (six) hours. Patient not taking: Reported on 03/20/2016 12/31/15   Wynne Dust, MD  norgestimate-ethinyl estradiol (ORTHO-CYCLEN,SPRINTEC,PREVIFEM) 0.25-35 MG-MCG tablet Take 1 tablet by mouth daily. 03/20/16   Willodean Rosenthal, MD  oxyCODONE-acetaminophen (PERCOCET/ROXICET) 5-325 MG tablet Take 1 tablet by mouth every 4 (four) hours as needed for severe pain. Patient not taking: Reported on 03/20/2016 12/31/15   Wynne Dust, MD  predniSONE (DELTASONE) 20 MG tablet Take 2 tablets (40 mg total) by mouth daily with breakfast. 10/24/16   Canary Brim  Pollina, MD  Prenatal Vit-Fe Fumarate-FA (PRENATAL VITAMINS) 28-0.8 MG TABS Take 1 tablet by mouth daily. Patient not taking: Reported on 03/20/2016 11/06/15   Misty StanleyLisa A Leftwich-Kirby, CNM  senna-docusate (SENOKOT-S) 8.6-50 MG tablet Take 2 tablets by mouth at bedtime as needed for mild constipation. Patient not taking: Reported on 03/20/2016 12/31/15   Wynne DustAmber Heckart, MD    Family History Family History  Problem Relation Age of Onset  . Diabetes Mother   . Hypertension Mother   .  Hypertension Maternal Grandmother   . Diabetes Maternal Grandmother   . Hypertension Maternal Grandfather   . Diabetes Maternal Grandfather   . Hypertension Maternal Aunt   . Diabetes Maternal Aunt   . Hypertension Maternal Uncle   . Diabetes Maternal Uncle   . Cancer Neg Hx   . Heart disease Neg Hx   . Stroke Neg Hx     Social History Social History  Substance Use Topics  . Smoking status: Never Smoker  . Smokeless tobacco: Never Used  . Alcohol use No     Allergies   Patient has no known allergies.   Review of Systems Review of Systems  Constitutional: Negative for chills and fever.  HENT: Positive for congestion, postnasal drip and rhinorrhea. Negative for ear pain and trouble swallowing.   Eyes: Negative for visual disturbance.  Respiratory: Positive for cough. Negative for shortness of breath and wheezing.   Cardiovascular: Negative for chest pain.  Skin: Negative for rash.  Neurological: Negative for headaches.     Physical Exam Updated Vital Signs BP 108/83 (BP Location: Right Arm)   Pulse 109   Temp 98.9 F (37.2 C) (Oral)   Resp 20   SpO2 100%   Physical Exam  Constitutional: She appears well-developed and well-nourished. No distress.  HENT:  Head: Normocephalic and atraumatic.  Right Ear: External ear normal.  Left Ear: External ear normal.  Mouth/Throat: Oropharynx is clear and moist. No oropharyngeal exudate.  Evidence of postnasal drip. No tonsillar hypertrophy or exudates. Mild middle ear effusion bilaterally TMs without erythema or loss of landmarks. Rhinorrhea present.  Eyes: Conjunctivae and EOM are normal. Pupils are equal, round, and reactive to light. Right eye exhibits no discharge. Left eye exhibits no discharge.  Neck: Normal range of motion. Neck supple. No JVD present. No tracheal deviation present.  Cardiovascular: Normal rate, regular rhythm, normal heart sounds and intact distal pulses.   Heart Rate is 88.   Pulmonary/Chest:  Effort normal and breath sounds normal. No stridor. No respiratory distress. She has no wheezes. She has no rales.  Clear to auscultation bilaterally. No increased work of breathing. No rales or rhonchi.  Abdominal: Soft. There is no tenderness.  Lymphadenopathy:    She has no cervical adenopathy.  Neurological: She is alert. Coordination normal.  Skin: Skin is warm and dry. Capillary refill takes less than 2 seconds. No rash noted. She is not diaphoretic. No erythema. No pallor.  Psychiatric: She has a normal mood and affect. Her behavior is normal.  Nursing note and vitals reviewed.    ED Treatments / Results   DIAGNOSTIC STUDIES: Oxygen Saturation is 100% on RA, normal by my interpretation.    COORDINATION OF CARE: 12:55 PM Discussed treatment plan with pt at bedside and pt agreed to plan.   Labs (all labs ordered are listed, but only abnormal results are displayed) Labs Reviewed - No data to display  EKG  EKG Interpretation None       Radiology No results  found.  Procedures Procedures (including critical care time)  Medications Ordered in ED Medications - No data to display   Initial Impression / Assessment and Plan / ED Course  I have reviewed the triage vital signs and the nursing notes.  Pertinent labs & imaging results that were available during my care of the patient were reviewed by me and considered in my medical decision making (see chart for details).    This is a 21 y.o. female who presents to the Emergency Department complaining of moderate, persistent, unchanged cough onset a couple weeks ago. Pt was last seen in ED on 10/24/16 for similar symptoms, and was told to f/u if symptoms worsened. She states she has finished her prescribed medications as directed with minimal relief. Pt reports associated postnasal drip, nasal congestion, and rhinorrhea. She denies any fevers. On exam the patient is afebrile nontoxic appearing. Lungs clear to auscultation  bilaterally. No wheezes. She has evidence of allergic rhinitis with postnasal drip, boggy nasal turbinates, and middle ear effusion bilaterally. I suspect this is causing her cough. We'll have her use Flonase, Zyrtec, Tessalon Perles and have her follow-up in the [facility]. I discussed return precautions. I advised the patient to follow-up with their primary care provider this week. I advised the patient to return to the emergency department with new or worsening symptoms or new concerns. The patient verbalized understanding and agreement with plan.    Final Clinical Impressions(s) / ED Diagnoses   Final diagnoses:  Viral URI with cough  Allergic rhinitis, unspecified chronicity, unspecified seasonality, unspecified trigger    New Prescriptions New Prescriptions   BENZONATATE (TESSALON) 100 MG CAPSULE    Take 1 capsule (100 mg total) by mouth 3 (three) times daily as needed for cough.   CETIRIZINE (ZYRTEC ALLERGY) 10 MG TABLET    Take 1 tablet (10 mg total) by mouth daily.   FLUTICASONE (FLONASE) 50 MCG/ACT NASAL SPRAY    Place 2 sprays into both nostrils daily.    I personally performed the services described in this documentation, which was scribed in my presence. The recorded information has been reviewed and is accurate.       Everlene Farrier, PA-C 12/20/16 1330    Courteney Randall An, MD 12/21/16 1948

## 2016-12-20 NOTE — ED Notes (Signed)
Language interpreter machine at bedside

## 2016-12-20 NOTE — ED Notes (Signed)
Declined W/C at D/C and was escorted to lobby by RN. 

## 2016-12-20 NOTE — ED Triage Notes (Signed)
Pt reports she was treated in the ED on 10-24-16 for cough and flu. Pt reports she finished all meds . And has not gotten better. Pt still haS DRYH COUGH. Pt. Denies fever.

## 2017-06-14 ENCOUNTER — Inpatient Hospital Stay (HOSPITAL_COMMUNITY)
Admission: AD | Admit: 2017-06-14 | Discharge: 2017-06-14 | Disposition: A | Discharge status: Home / Self Care | Payer: Medicaid Other | Source: Ambulatory Visit | Attending: Obstetrics & Gynecology | Admitting: Obstetrics & Gynecology

## 2017-06-14 ENCOUNTER — Encounter (HOSPITAL_COMMUNITY): Payer: Self-pay | Admitting: *Deleted

## 2017-06-14 DIAGNOSIS — O219 Vomiting of pregnancy, unspecified: Secondary | ICD-10-CM

## 2017-06-14 DIAGNOSIS — Z3A01 Less than 8 weeks gestation of pregnancy: Secondary | ICD-10-CM | POA: Diagnosis not present

## 2017-06-14 DIAGNOSIS — O210 Mild hyperemesis gravidarum: Secondary | ICD-10-CM | POA: Diagnosis present

## 2017-06-14 DIAGNOSIS — Z3201 Encounter for pregnancy test, result positive: Secondary | ICD-10-CM

## 2017-06-14 LAB — URINALYSIS, ROUTINE W REFLEX MICROSCOPIC
Bilirubin Urine: NEGATIVE
Glucose, UA: NEGATIVE mg/dL
Hgb urine dipstick: NEGATIVE
Ketones, ur: NEGATIVE mg/dL
LEUKOCYTES UA: NEGATIVE
NITRITE: NEGATIVE
PH: 5 (ref 5.0–8.0)
Protein, ur: NEGATIVE mg/dL
Specific Gravity, Urine: 1.006 (ref 1.005–1.030)

## 2017-06-14 LAB — POCT PREGNANCY, URINE: PREG TEST UR: POSITIVE — AB

## 2017-06-14 MED ORDER — METOCLOPRAMIDE HCL 10 MG PO TABS: 10.0000 mg | ORAL_TABLET | Freq: Three times a day (TID) | ORAL | 0 refills | Status: DC | PRN

## 2017-06-14 NOTE — MAU Provider Note (Signed)
History     CSN: [id number]  Arrival date and time: 06/14/17 1628  None     Chief Complaint  Patient presents with  . Nausea  . Possible Pregnancy   HPI Claudia Lucas is a 21 y.o. G2P1001 at [redacted]w[redacted]d by LMP who presents with nausea. Has positive pregnancy test at home earlier this week. Felt like she was going to vomit 4 times today but did not actually vomit. Denies abdominal pain, vaginal bleeding, vaginal discharge, diarrhea, heartburn, or constipation.   Arabic interpreter via video line used.   OB History    Gravida Para Term Preterm AB Living   2 1 1     1    SAB TAB Ectopic Multiple Live Births         0 1      Past Medical History:  Diagnosis Date  . Anemia   . Ovarian cyst     Past Surgical History:  Procedure Laterality Date  . CESAREAN SECTION N/A 12/29/2015   Procedure: CESAREAN SECTION;  Surgeon: Lesly Dukes, MD;  Location: [facility];  Service: Obstetrics;  Laterality: N/A;    Family History  Problem Relation Age of Onset  . Diabetes Mother   . Hypertension Mother   . Hypertension Maternal Grandmother   . Diabetes Maternal Grandmother   . Hypertension Maternal Grandfather   . Diabetes Maternal Grandfather   . Hypertension Maternal Aunt   . Diabetes Maternal Aunt   . Hypertension Maternal Uncle   . Diabetes Maternal Uncle   . Cancer Neg Hx   . Heart disease Neg Hx   . Stroke Neg Hx     Social History  Substance Use Topics  . Smoking status: Never Smoker  . Smokeless tobacco: Never Used  . Alcohol use No    Allergies: No Known Allergies  Prescriptions Prior to Admission  Medication Sig Dispense Refill Last Dose  . fluticasone (FLONASE) 50 MCG/ACT nasal spray Place 2 sprays into both nostrils daily. 16 g 0 Past Month at Unknown time  . acetaminophen (TYLENOL) 325 MG tablet Take 2 tablets (650 mg total) by mouth every 4 (four) hours as needed (for pain scale < 4). (Patient not taking: Reported on 03/20/2016) 30 tablet 0 Not Taking  . albuterol  (PROVENTIL HFA;VENTOLIN HFA) 108 (90 Base) MCG/ACT inhaler Inhale 2 puffs into the lungs every 4 (four) hours as needed for wheezing or shortness of breath. (Patient not taking: Reported on 06/14/2017) 1 Inhaler 2 Not Taking at Unknown time  . benzonatate (TESSALON) 100 MG capsule Take 1 capsule (100 mg total) by mouth 3 (three) times daily as needed for cough. (Patient not taking: Reported on 06/14/2017) 21 capsule 0 Not Taking at Unknown time  . cetirizine (ZYRTEC ALLERGY) 10 MG tablet Take 1 tablet (10 mg total) by mouth daily. (Patient not taking: Reported on 06/14/2017) 30 tablet 1 Not Taking at Unknown time  . ibuprofen (ADVIL,MOTRIN) 600 MG tablet Take 1 tablet (600 mg total) by mouth every 6 (six) hours. (Patient not taking: Reported on 03/20/2016) 30 tablet 0 Not Taking  . norgestimate-ethinyl estradiol (ORTHO-CYCLEN,SPRINTEC,PREVIFEM) 0.25-35 MG-MCG tablet Take 1 tablet by mouth daily. (Patient not taking: Reported on 06/14/2017) 1 Package 11 Not Taking at Unknown time  . oxyCODONE-acetaminophen (PERCOCET/ROXICET) 5-325 MG tablet Take 1 tablet by mouth every 4 (four) hours as needed for severe pain. (Patient not taking: Reported on 03/20/2016) 20 tablet 0 Not Taking  . predniSONE (DELTASONE) 20 MG tablet Take 2 tablets (40 mg total)  by mouth daily with breakfast. (Patient not taking: Reported on 06/14/2017) 10 tablet 0 Not Taking at Unknown time  . Prenatal Vit-Fe Fumarate-FA (PRENATAL VITAMINS) 28-0.8 MG TABS Take 1 tablet by mouth daily. (Patient not taking: Reported on 03/20/2016) 30 tablet 5 Not Taking  . senna-docusate (SENOKOT-S) 8.6-50 MG tablet Take 2 tablets by mouth at bedtime as needed for mild constipation. (Patient not taking: Reported on 03/20/2016) 30 tablet 0 Not Taking    Review of Systems  Constitutional: Negative.   Gastrointestinal: Positive for nausea. Negative for abdominal pain, constipation, diarrhea and vomiting.  Genitourinary: Negative.   Neurological: Negative.     Physical Exam   Blood pressure 106/64, pulse 86, temperature 97.9 F (36.6 C), temperature source Oral, resp. rate 17, weight 123 lb 12 oz (56.1 kg), last menstrual period 04/25/2017, SpO2 100 %, currently breastfeeding.  Physical Exam  Nursing note and vitals reviewed. Constitutional: She is oriented to person, place, and time. She appears well-developed and well-nourished. No distress.  HENT:  Head: Normocephalic and atraumatic.  Eyes: Conjunctivae are normal. Right eye exhibits no discharge. Left eye exhibits no discharge. No scleral icterus.  Neck: Normal range of motion.  Respiratory: Effort normal. No respiratory distress.  GI: Soft. There is no tenderness.  Neurological: She is alert and oriented to person, place, and time.  Skin: Skin is warm and dry. She is not diaphoretic.  Psychiatric: She has a normal mood and affect. Her behavior is normal. Judgment and thought content normal.    MAU Course  Procedures Results for orders placed or performed during the hospital encounter of 06/14/17 (from the past 24 hour(s))  Urinalysis, Routine w reflex microscopic     Status: Abnormal   Collection Time: 06/14/17  5:00 PM  Result Value Ref Range   Color, Urine STRAW (A) YELLOW   APPearance CLEAR CLEAR   Specific Gravity, Urine 1.006 1.005 - 1.030   pH 5.0 5.0 - 8.0   Glucose, UA NEGATIVE NEGATIVE mg/dL   Hgb urine dipstick NEGATIVE NEGATIVE   Bilirubin Urine NEGATIVE NEGATIVE   Ketones, ur NEGATIVE NEGATIVE mg/dL   Protein, ur NEGATIVE NEGATIVE mg/dL   Nitrite NEGATIVE NEGATIVE   Leukocytes, UA NEGATIVE NEGATIVE  Pregnancy, urine POC     Status: Abnormal   Collection Time: 06/14/17  5:14 PM  Result Value Ref Range   Preg Test, Ur POSITIVE (A) NEGATIVE    MDM UPT positive No abdominal pain or vaginal bleeding No vomiting in MAU & u/a doesn't indicate dehydration  Assessment and Plan  A:  1. Nausea and vomiting during pregnancy prior to [redacted] weeks gestation   2.  Positive pregnancy test    P: Discharge home Rx reglan Start prenatal care Discussed reasons to return to MAU  Claudia Lucas 06/14/2017, 5:37 PM

## 2017-06-14 NOTE — MAU Note (Signed)
Missed her period, preg symptoms" stomach upset,vomiting, feels weak. Had pain in ? Uterus a wk ago, after 2 days it stopped.  No bleeding.  +HPT week ago Monday.

## 2017-06-14 NOTE — MAU Note (Signed)
VRI used, Zayneb [id number]:  Discussed POC, discharge instructions, medication instructions, lab results. Pt.'s questions asked and answered.

## 2017-06-14 NOTE — Discharge Instructions (Signed)
Morning Sickness °Morning sickness is when you feel sick to your stomach (nauseous) during pregnancy. This nauseous feeling may or may not come with vomiting. It often occurs in the morning but can be a problem any time of day. Morning sickness is most common during the first trimester, but it may continue throughout pregnancy. While morning sickness is unpleasant, it is usually harmless unless you develop severe and continual vomiting (hyperemesis gravidarum). This condition requires more intense treatment. °What are the causes? °The cause of morning sickness is not completely known but seems to be related to normal hormonal changes that occur in pregnancy. °What increases the risk? °You are at greater risk if you: °· Experienced nausea or vomiting before your pregnancy. °· Had morning sickness during a previous pregnancy. °· Are pregnant with more than one baby, such as twins. ° °How is this treated? °Do not use any medicines (prescription, over-the-counter, or herbal) for morning sickness without first talking to your health care provider. Your health care provider may prescribe or recommend: °· Vitamin B6 supplements. °· Anti-nausea medicines. °· The herbal medicine ginger. ° °Follow these instructions at home: °· Only take over-the-counter or prescription medicines as directed by your health care provider. °· Taking multivitamins before getting pregnant can prevent or decrease the severity of morning sickness in most women. °· Eat a piece of dry toast or unsalted crackers before getting out of bed in the morning. °· Eat five or six small meals a day. °· Eat dry and bland foods (rice, baked potato). Foods high in carbohydrates are often helpful. °· Do not drink liquids with your meals. Drink liquids between meals. °· Avoid greasy, fatty, and spicy foods. °· Get someone to cook for you if the smell of any food causes nausea and vomiting. °· If you feel nauseous after taking prenatal vitamins, take the vitamins at  night or with a snack. °· Snack on protein foods (nuts, yogurt, cheese) between meals if you are hungry. °· Eat unsweetened gelatins for desserts. °· Wearing an acupressure wristband (worn for sea sickness) may be helpful. °· Acupuncture may be helpful. °· Do not smoke. °· Get a humidifier to keep the air in your house free of odors. °· Get plenty of fresh air. °Contact a health care provider if: °· Your home remedies are not working, and you need medicine. °· You feel dizzy or lightheaded. °· You are losing weight. °Get help right away if: °· You have persistent and uncontrolled nausea and vomiting. °· You pass out (faint). °This information is not intended to replace advice given to you by your health care provider. Make sure you discuss any questions you have with your health care provider. °Document Released: 01/07/2007 Document Revised: 04/23/2016 Document Reviewed: 05/03/2013 °Elsevier Interactive Patient Education © 2017 Elsevier Inc. ° °

## 2017-08-10 ENCOUNTER — Other Ambulatory Visit (HOSPITAL_COMMUNITY)
Admission: RE | Admit: 2017-08-10 | Discharge: 2017-08-10 | Disposition: A | Discharge status: Home / Self Care | Payer: Medicaid Other | Source: Ambulatory Visit | Attending: Obstetrics and Gynecology | Admitting: Obstetrics and Gynecology

## 2017-08-10 ENCOUNTER — Ambulatory Visit (INDEPENDENT_AMBULATORY_CARE_PROVIDER_SITE_OTHER): Payer: Medicaid Other | Admitting: Obstetrics and Gynecology

## 2017-08-10 ENCOUNTER — Encounter: Payer: Self-pay | Admitting: Obstetrics and Gynecology

## 2017-08-10 VITALS — BP 108/75 | HR 101 | Wt 126.0 lb

## 2017-08-10 DIAGNOSIS — O99019 Anemia complicating pregnancy, unspecified trimester: Secondary | ICD-10-CM | POA: Insufficient documentation

## 2017-08-10 DIAGNOSIS — D649 Anemia, unspecified: Secondary | ICD-10-CM

## 2017-08-10 DIAGNOSIS — Z3482 Encounter for supervision of other normal pregnancy, second trimester: Secondary | ICD-10-CM

## 2017-08-10 DIAGNOSIS — Z3480 Encounter for supervision of other normal pregnancy, unspecified trimester: Secondary | ICD-10-CM | POA: Insufficient documentation

## 2017-08-10 DIAGNOSIS — Z23 Encounter for immunization: Secondary | ICD-10-CM

## 2017-08-10 DIAGNOSIS — Z3A00 Weeks of gestation of pregnancy not specified: Secondary | ICD-10-CM | POA: Insufficient documentation

## 2017-08-10 DIAGNOSIS — O34219 Maternal care for unspecified type scar from previous cesarean delivery: Secondary | ICD-10-CM

## 2017-08-10 DIAGNOSIS — O99012 Anemia complicating pregnancy, second trimester: Secondary | ICD-10-CM | POA: Diagnosis not present

## 2017-08-10 LAB — POCT URINALYSIS DIP (DEVICE)
BILIRUBIN URINE: NEGATIVE
GLUCOSE, UA: NEGATIVE mg/dL
Hgb urine dipstick: NEGATIVE
KETONES UR: NEGATIVE mg/dL
Nitrite: NEGATIVE
PH: 7 (ref 5.0–8.0)
PROTEIN: NEGATIVE mg/dL
SPECIFIC GRAVITY, URINE: 1.015 (ref 1.005–1.030)
Urobilinogen, UA: 0.2 mg/dL (ref 0.0–1.0)

## 2017-08-10 NOTE — Progress Notes (Signed)
Subjective:   Claudia Lucas is a 21 y.o. G2P1001 at [redacted]w[redacted]d by LMP being seen today for her first obstetrical visit.  Her obstetrical history is significant for anemia, and prior cesarean due to failure to descend . Patient does intend to breast feed. Pregnancy history fully reviewed. Planned pregnancy; FOB involved [facility]   Patient reports no complaints.  HISTORY: Obstetric History   G2   P1   T1   P0   A0   L1    SAB0   TAB0   Ectopic0   Multiple0   Live Births1     # Outcome Date GA Lbr Len/2nd Weight Sex Delivery Anes PTL Lv  2 Current           1 Term 12/29/15 [redacted]w[redacted]d 16:16 / 05:12 2.84 kg (6 lb 4.2 oz) M CS-LTranv EPI  LIV     Name: Courtois,BOY Lichelle     Apgar1:  7                Apgar5: 8     Past Medical History:  Diagnosis Date  . Anemia   . Ovarian cyst    Past Surgical History:  Procedure Laterality Date  . CESAREAN SECTION N/A 12/29/2015   Procedure: CESAREAN SECTION;  Surgeon: Lesly Dukes, MD;  Location: [facility];  Service: Obstetrics;  Laterality: N/A;   Family History  Problem Relation Age of Onset  . Diabetes Mother   . Hypertension Mother   . Hypertension Maternal Grandmother   . Diabetes Maternal Grandmother   . Hypertension Maternal Grandfather   . Diabetes Maternal Grandfather   . Hypertension Maternal Aunt   . Diabetes Maternal Aunt   . Hypertension Maternal Uncle   . Diabetes Maternal Uncle   . Cancer Neg Hx   . Heart disease Neg Hx   . Stroke Neg Hx    Social History  Substance Use Topics  . Smoking status: Never Smoker  . Smokeless tobacco: Never Used  . Alcohol use No   No Known Allergies Current Outpatient Prescriptions on File Prior to Visit  Medication Sig Dispense Refill  . fluticasone (FLONASE) 50 MCG/ACT nasal spray Place 2 sprays into both nostrils daily. 16 g 0  . metoCLOPramide (REGLAN) 10 MG tablet Take 1 tablet (10 mg total) by mouth every 8 (eight) hours as needed for nausea. (Patient not taking: Reported on  08/10/2017) 30 tablet 0   No current facility-administered medications on file prior to visit.      Exam   Vitals:   08/10/17 0824  BP: 108/75  Pulse: (!) 101  Weight: 57.2 kg (126 lb)   Fetal Heart Rate (bpm): 155  Uterus:   System: General: well-developed, well-nourished female in no acute distress   Breast:  normal appearance, no masses or tenderness   Skin: normal coloration and turgor, no rashes   Neurologic: oriented, normal, negative, normal mood   Extremities: normal strength, tone, and muscle mass, ROM of all joints is normal   HEENT PERRLA, extraocular movement intact and sclera clear, anicteric   Mouth/Teeth mucous membranes moist, pharynx normal without lesions and dental hygiene good   Neck supple and no masses   Cardiovascular: regular rate and rhythm   Respiratory:  no respiratory distress, normal breath sounds   Abdomen: soft, non-tender; bowel sounds normal; no masses,  no organomegaly     Assessment:   Pregnancy: G2P1001 Patient Active Problem List   Diagnosis Date Noted  . Supervision of  other normal pregnancy, antepartum 08/10/2017  . Previous cesarean delivery, delivered 12/31/2015     Plan:   1. Supervision of other normal pregnancy, antepartum  - Obstetric Panel, Including HIV - Culture, OB Urine - Hemoglobinopathy evaluation - Flu Vaccine QUAD 36+ mos IM - US MFM OB COMP + 14 WK; Future - Cervicovaginal ancillary only - Hemoglobin A1c  2. Previous cesarean delivery, delivered  - Plans for TOLAC  3. Anemia during pregnancy in second trimester  Previous pregnancy Will wait for CBC to see results    Initial labs drawn. Continue prenatal vitamins. Genetic Screening discussed, Declines genetic testing , First trimester screen, Quad screen and NIPS: declined. Ultrasound discussed; fetal anatomic survey: ordered. Problem list reviewed and updated. The nature of [facility] with multiple MDs and other  Advanced Practice Providers was explained to patient; also emphasized that residents, students are part of our team. Routine obstetric precautions reviewed. Return in about 4 weeks (around 09/07/2017).    Delrose Rohwer, Harolyn RutherfordJennifer I, NP [facility]

## 2017-08-10 NOTE — Progress Notes (Signed)
Patient here today as a new OB visit 5364w2d.

## 2017-08-11 LAB — CERVICOVAGINAL ANCILLARY ONLY
CHLAMYDIA, DNA PROBE: NEGATIVE
NEISSERIA GONORRHEA: NEGATIVE
TRICH (WINDOWPATH): NEGATIVE

## 2017-08-12 LAB — OBSTETRIC PANEL, INCLUDING HIV
ANTIBODY SCREEN: NEGATIVE
BASOS: 0 %
Basophils Absolute: 0 10*3/uL (ref 0.0–0.2)
EOS (ABSOLUTE): 0.3 10*3/uL (ref 0.0–0.4)
EOS: 6 %
HIV SCREEN 4TH GENERATION: NONREACTIVE
Hematocrit: 35.6 % (ref 34.0–46.6)
Hemoglobin: 11.8 g/dL (ref 11.1–15.9)
Hepatitis B Surface Ag: NEGATIVE
Immature Grans (Abs): 0 10*3/uL (ref 0.0–0.1)
Immature Granulocytes: 0 %
LYMPHS ABS: 1.7 10*3/uL (ref 0.7–3.1)
Lymphs: 36 %
MCH: 31.1 pg (ref 26.6–33.0)
MCHC: 33.1 g/dL (ref 31.5–35.7)
MCV: 94 fL (ref 79–97)
Monocytes Absolute: 0.2 10*3/uL (ref 0.1–0.9)
Monocytes: 5 %
NEUTROS ABS: 2.6 10*3/uL (ref 1.4–7.0)
Neutrophils: 53 %
PLATELETS: 258 10*3/uL (ref 150–379)
RBC: 3.8 x10E6/uL (ref 3.77–5.28)
RDW: 13.4 % (ref 12.3–15.4)
RH TYPE: POSITIVE
RPR: NONREACTIVE
Rubella Antibodies, IGG: 18.1 index (ref >0.99)
WBC: 4.8 10*3/uL (ref 3.4–10.8)

## 2017-08-12 LAB — HEMOGLOBINOPATHY EVALUATION
HGB C: 0 %
HGB S: 0 %
HGB VARIANT: 0 %
Hemoglobin A2 Quantitation: 2.8 % (ref 1.8–3.2)
Hemoglobin F Quantitation: 0 % (ref 0.0–2.0)
Hgb A: 97.2 % (ref 96.4–98.8)

## 2017-08-12 LAB — HEMOGLOBIN A1C
Est. average glucose Bld gHb Est-mCnc: 88 mg/dL
Hgb A1c MFr Bld: 4.7 % — ABNORMAL LOW (ref 4.8–5.6)

## 2017-08-13 LAB — URINE CYTOLOGY ANCILLARY ONLY
BACTERIAL VAGINITIS: POSITIVE — AB
Candida vaginitis: NEGATIVE

## 2017-08-13 LAB — URINE CULTURE, OB REFLEX

## 2017-08-13 LAB — CULTURE, OB URINE

## 2017-08-18 ENCOUNTER — Telehealth: Payer: Self-pay | Admitting: General Practice

## 2017-08-18 DIAGNOSIS — N760 Acute vaginitis: Principal | ICD-10-CM

## 2017-08-18 DIAGNOSIS — B9689 Other specified bacterial agents as the cause of diseases classified elsewhere: Secondary | ICD-10-CM

## 2017-08-18 MED ORDER — METRONIDAZOLE 500 MG PO TABS
500.0000 mg | ORAL_TABLET | Freq: Two times a day (BID) | ORAL | 0 refills | Status: DC
Start: 2017-08-18 — End: 2017-11-16

## 2017-08-18 MED ORDER — METRONIDAZOLE 500 MG PO TABS: 500.0000 mg | ORAL_TABLET | Freq: Two times a day (BID) | ORAL | 0 refills | Status: DC

## 2017-08-18 NOTE — Telephone Encounter (Signed)
-----   Message from Duane Lope, NP sent at 08/16/2017  2:14 PM EDT ----- Patient has anemia can you notify her to take Iron daily? Also + BV, if she is symptomatic can you call her in flagyl please?  Thank you!  Venia Carbon    ----- Message ----- From: Interface, Lab In Three Zero Seven Sent: 08/11/2017   3:36 PM To: Duane Lope, NP

## 2017-08-18 NOTE — Telephone Encounter (Signed)
Called patient with pacific interpreter [phone] and informed her of results & recommendations. Patient states she does have a discharge with odor. Told patient we will send a prescription to her pharmacy to treat it. Patient verbalized understanding & had no questions

## 2017-09-06 ENCOUNTER — Other Ambulatory Visit: Payer: Self-pay | Admitting: Obstetrics and Gynecology

## 2017-09-06 ENCOUNTER — Ambulatory Visit (HOSPITAL_COMMUNITY)
Admission: RE | Admit: 2017-09-06 | Discharge: 2017-09-06 | Disposition: A | Discharge status: Home / Self Care | Payer: Medicaid Other | Source: Ambulatory Visit | Attending: Obstetrics and Gynecology | Admitting: Obstetrics and Gynecology

## 2017-09-06 DIAGNOSIS — Z3480 Encounter for supervision of other normal pregnancy, unspecified trimester: Secondary | ICD-10-CM

## 2017-09-06 DIAGNOSIS — Z3A19 19 weeks gestation of pregnancy: Secondary | ICD-10-CM

## 2017-09-06 DIAGNOSIS — O34219 Maternal care for unspecified type scar from previous cesarean delivery: Secondary | ICD-10-CM

## 2017-09-06 DIAGNOSIS — O34211 Maternal care for low transverse scar from previous cesarean delivery: Secondary | ICD-10-CM | POA: Insufficient documentation

## 2017-09-06 DIAGNOSIS — Z3689 Encounter for other specified antenatal screening: Secondary | ICD-10-CM | POA: Diagnosis not present

## 2017-09-07 ENCOUNTER — Ambulatory Visit (INDEPENDENT_AMBULATORY_CARE_PROVIDER_SITE_OTHER): Payer: Medicaid Other | Admitting: Medical

## 2017-09-07 VITALS — BP 90/60 | HR 91 | Wt 128.7 lb

## 2017-09-07 DIAGNOSIS — O99012 Anemia complicating pregnancy, second trimester: Secondary | ICD-10-CM

## 2017-09-07 DIAGNOSIS — Z3482 Encounter for supervision of other normal pregnancy, second trimester: Secondary | ICD-10-CM

## 2017-09-07 DIAGNOSIS — Z3480 Encounter for supervision of other normal pregnancy, unspecified trimester: Secondary | ICD-10-CM

## 2017-09-07 DIAGNOSIS — B3731 Acute candidiasis of vulva and vagina: Secondary | ICD-10-CM

## 2017-09-07 DIAGNOSIS — B373 Candidiasis of vulva and vagina: Secondary | ICD-10-CM

## 2017-09-07 DIAGNOSIS — O34219 Maternal care for unspecified type scar from previous cesarean delivery: Secondary | ICD-10-CM

## 2017-09-07 MED ORDER — TERCONAZOLE 0.8 % VA CREA: 1.0000 | TOPICAL_CREAM | Freq: Every day | VAGINAL | 0 refills | Status: DC

## 2017-09-07 NOTE — Progress Notes (Signed)
[employer] interpreter Asmaa [id number]

## 2017-09-07 NOTE — Patient Instructions (Signed)
Second Trimester of Pregnancy The second trimester is from week 13 through week 28, month 4 through 6. This is often the time in pregnancy that you feel your best. Often times, morning sickness has lessened or quit. You may have more energy, and you may get hungry more often. Your unborn baby (fetus) is growing rapidly. At the end of the sixth month, he or she is about 9 inches long and weighs about 1 pounds. You will likely feel the baby move (quickening) between 18 and 20 weeks of pregnancy. Follow these instructions at home:  Avoid all smoking, herbs, and alcohol. Avoid drugs not approved by your doctor.  Do not use any tobacco products, including cigarettes, chewing tobacco, and electronic cigarettes. If you need help quitting, ask your doctor. You may get counseling or other support to help you quit.  Only take medicine as told by your doctor. Some medicines are safe and some are not during pregnancy.  Exercise only as told by your doctor. Stop exercising if you start having cramps.  Eat regular, healthy meals.  Wear a good support bra if your breasts are tender.  Do not use hot tubs, steam rooms, or saunas.  Wear your seat belt when driving.  Avoid raw meat, uncooked cheese, and liter boxes and soil used by cats.  Take your prenatal vitamins.  Take 1500-2000 milligrams of calcium daily starting at the 20th week of pregnancy until you deliver your baby.  Try taking medicine that helps you poop (stool softener) as needed, and if your doctor approves. Eat more fiber by eating fresh fruit, vegetables, and whole grains. Drink enough fluids to keep your pee (urine) clear or pale yellow.  Take warm water baths (sitz baths) to soothe pain or discomfort caused by hemorrhoids. Use hemorrhoid cream if your doctor approves.  If you have puffy, bulging veins (varicose veins), wear support hose. Raise (elevate) your feet for 15 minutes, 3-4 times a day. Limit salt in your diet.  Avoid heavy  lifting, wear low heals, and sit up straight.  Rest with your legs raised if you have leg cramps or low back pain.  Visit your dentist if you have not gone during your pregnancy. Use a soft toothbrush to brush your teeth. Be gentle when you floss.  You can have sex (intercourse) unless your doctor tells you not to.  Go to your doctor visits. Get help if:  You feel dizzy.  You have mild cramps or pressure in your lower belly (abdomen).  You have a nagging pain in your belly area.  You continue to feel sick to your stomach (nauseous), throw up (vomit), or have watery poop (diarrhea).  You have bad smelling fluid coming from your vagina.  You have pain with peeing (urination). Get help right away if:  You have a fever.  You are leaking fluid from your vagina.  You have spotting or bleeding from your vagina.  You have severe belly cramping or pain.  You lose or gain weight rapidly.  You have trouble catching your breath and have chest pain.  You notice sudden or extreme puffiness (swelling) of your face, hands, ankles, feet, or legs.  You have not felt the baby move in over an hour.  You have severe headaches that do not go away with medicine.  You have vision changes. This information is not intended to replace advice given to you by your health care provider. Make sure you discuss any questions you have with your health care   provider. Document Released: 02/10/2010 Document Revised: 04/23/2016 Document Reviewed: 01/17/2013 Elsevier Interactive Patient Education  2017 Elsevier Inc.  

## 2017-09-07 NOTE — Progress Notes (Signed)
   PRENATAL VISIT NOTE  Subjective:  Claudia Lucas is a 21 y.o. G2P1001 at [redacted]w[redacted]d being seen today for ongoing prenatal care.  She is currently monitored for the following issues for this low-risk pregnancy and has Previous cesarean delivery, delivered; Supervision of other normal pregnancy, antepartum; and Anemia of mother in pregnancy on her problem list.  Patient reports no complaints.  Contractions: Not present. Vag. Bleeding: None.  Movement: Present. Denies leaking of fluid.   The following portions of the patient's history were reviewed and updated as appropriate: allergies, current medications, past family history, past medical history, past social history, past surgical history and problem list. Problem list updated.  Objective:   Vitals:   09/07/17 1105  BP: 90/60  Pulse: 91  Weight: 128 lb 11.2 oz (58.4 kg)    Fetal Status: Fetal Heart Rate (bpm): 155   Movement: Present     General:  Alert, oriented and cooperative. Patient is in no acute distress.  Skin: Skin is warm and dry. No rash noted.   Cardiovascular: Normal heart rate noted  Respiratory: Normal respiratory effort, no problems with respiration noted  Abdomen: Soft, gravid, appropriate for gestational age.  Pain/Pressure: Absent     Pelvic: Cervical exam deferred        Extremities: Normal range of motion.  Edema: None  Mental Status:  Normal mood and affect. Normal behavior. Normal judgment and thought content.   Assessment and Plan:  Pregnancy: G2P1001 at [redacted]w[redacted]d  1. Supervision of other normal pregnancy, antepartum - Doing well - Seasonal allergy symptoms - list of OTC medications safe in pregnancy given   2. Previous cesarean delivery, delivered - Desires TOLAC, will need consent signed at 28 weeks  3. Anemia during pregnancy in second trimester - Taking Iron, will re-check at 28 weeks  4. Yeast vaginitis - terconazole (TERAZOL 3) 0.8 % vaginal cream; Place 1 applicator vaginally at bedtime.  Dispense:  20 g; Refill: 0  Preterm/second trimester warning symptoms and general obstetric precautions including but not limited to vaginal bleeding, contractions, leaking of fluid and fetal movement were reviewed in detail with the patient. Please refer to After Visit Summary for other counseling recommendations.  Return in about 4 weeks (around 10/05/2017) for LOB.   Vonzella Nipple, PA-C

## 2017-10-05 ENCOUNTER — Ambulatory Visit (INDEPENDENT_AMBULATORY_CARE_PROVIDER_SITE_OTHER): Payer: Medicaid Other | Admitting: Obstetrics and Gynecology

## 2017-10-05 VITALS — BP 102/62 | HR 94

## 2017-10-05 DIAGNOSIS — R059 Cough, unspecified: Secondary | ICD-10-CM | POA: Insufficient documentation

## 2017-10-05 DIAGNOSIS — Z3480 Encounter for supervision of other normal pregnancy, unspecified trimester: Secondary | ICD-10-CM

## 2017-10-05 DIAGNOSIS — R05 Cough: Secondary | ICD-10-CM | POA: Insufficient documentation

## 2017-10-05 MED ORDER — BENZONATATE 200 MG PO CAPS: 200.0000 mg | ORAL_CAPSULE | Freq: Two times a day (BID) | ORAL | 1 refills | Status: DC | PRN

## 2017-10-05 NOTE — Progress Notes (Signed)
Video Interpreter # [phone]    PRENATAL VISIT NOTE  Subjective:  Claudia Lucas is a 21 y.o. G2P1001 at 4565w2d being seen today for ongoing prenatal care.  She is currently monitored for the following issues for this low-risk pregnancy and has Previous cesarean delivery, delivered; Supervision of other normal pregnancy, antepartum; Anemia of mother in pregnancy; and Cough on their problem list.  Patient reports cough. Has had a cold. Would like something at night for her cough. No fever. No other symptoms.   Contractions: Not present. Vag. Bleeding: None.  Movement: Present. Denies leaking of fluid.   The following portions of the patient's history were reviewed and updated as appropriate: allergies, current medications, past family history, past medical history, past social history, past surgical history and problem list. Problem list updated.  Objective:   Vitals:   10/05/17 1058  BP: 102/62  Pulse: 94    Fetal Status: Fetal Heart Rate (bpm): 152 Fundal Height: 23 cm Movement: Present     General:  Alert, oriented and cooperative. Patient is in no acute distress.  Skin: Skin is warm and dry. No rash noted.   Cardiovascular: Normal heart rate noted  Respiratory: Normal respiratory effort, no problems with respiration noted  Abdomen: Soft, gravid, appropriate for gestational age.  Pain/Pressure: Absent     Pelvic: Cervical exam deferred        Extremities: Normal range of motion.  Edema: None  Mental Status:  Normal mood and affect. Normal behavior. Normal judgment and thought content.   Assessment and Plan:  Pregnancy: G2P1001 at 6765w2d  1. Cough  -Rx: Tessalon perles, OTC options discussed   2. Supervision of other normal pregnancy, antepartum  - Doing well    Preterm labor symptoms and general obstetric precautions including but not limited to vaginal bleeding, contractions, leaking of fluid and fetal movement were reviewed in detail with the patient. Please refer to  After Visit Summary for other counseling recommendations.  Return in about 4 weeks (around 11/02/2017).   Venia CarbonJennifer Rasch, NP

## 2017-11-02 ENCOUNTER — Ambulatory Visit (INDEPENDENT_AMBULATORY_CARE_PROVIDER_SITE_OTHER): Payer: Medicaid Other | Admitting: Student

## 2017-11-02 VITALS — BP 104/60 | HR 84 | Wt 135.5 lb

## 2017-11-02 DIAGNOSIS — Z3480 Encounter for supervision of other normal pregnancy, unspecified trimester: Secondary | ICD-10-CM

## 2017-11-02 DIAGNOSIS — O26842 Uterine size-date discrepancy, second trimester: Secondary | ICD-10-CM | POA: Insufficient documentation

## 2017-11-02 DIAGNOSIS — R12 Heartburn: Secondary | ICD-10-CM

## 2017-11-02 DIAGNOSIS — Z789 Other specified health status: Secondary | ICD-10-CM | POA: Insufficient documentation

## 2017-11-02 DIAGNOSIS — Z3482 Encounter for supervision of other normal pregnancy, second trimester: Secondary | ICD-10-CM

## 2017-11-02 DIAGNOSIS — O26892 Other specified pregnancy related conditions, second trimester: Secondary | ICD-10-CM

## 2017-11-02 MED ORDER — OMEPRAZOLE 20 MG PO CPDR: 20.0000 mg | DELAYED_RELEASE_CAPSULE | Freq: Every day | ORAL | 0 refills | Status: DC

## 2017-11-02 NOTE — Progress Notes (Addendum)
   PRENATAL VISIT NOTE  Subjective:  Claudia Lucas is a 21 y.o. G2P1001 at 2517w2d being seen today for ongoing prenatal care.  She is currently monitored for the following issues for this high-risk pregnancy and has Previous cesarean delivery, delivered; Supervision of other normal pregnancy, antepartum; Anemia of mother in pregnancy; Cough; Language barrier; and Uterine size date discrepancy pregnancy, second trimester on their problem list.  Patient reports heartburn.  Contractions: Not present. Vag. Bleeding: None.  Movement: Present. Denies leaking of fluid.   The following portions of the patient's history were reviewed and updated as appropriate: allergies, current medications, past family history, past medical history, past social history, past surgical history and problem list. Problem list updated.  Objective:   Vitals:   11/02/17 1150  BP: 104/60  Pulse: 84  Weight: 135 lb 8 oz (61.5 kg)    Fetal Status: Fetal Heart Rate (bpm): 157 Fundal Height: 24 cm Movement: Present     General:  Alert, oriented and cooperative. Patient is in no acute distress.  Skin: Skin is warm and dry. No rash noted.   Cardiovascular: Normal heart rate noted  Respiratory: Normal respiratory effort, no problems with respiration noted  Abdomen: Soft, gravid, appropriate for gestational age.  Pain/Pressure: Present     Pelvic: Cervical exam deferred        Extremities: Normal range of motion.  Edema: None  Mental Status:  Normal mood and affect. Normal behavior. Normal judgment and thought content.   Assessment and Plan:  Pregnancy: G2P1001 at 2917w2d  1. Supervision of other normal pregnancy, antepartum -wants TOLAC --- needs to sign consent using interpreter -not fasting -- will bring back next week for lab visit for 2hr gtt  2. Heartburn during pregnancy in second trimester  - omeprazole (PRILOSEC) 20 MG capsule; Take 1 capsule (20 mg total) by mouth daily.  Dispense: 30 capsule; Refill: 0  3.  Language barrier -Arabic video interpreter used  4. Uterine size date discrepancy pregnancy, second trimester -reassess next visit, may need growth ultrasound  Preterm labor symptoms and general obstetric precautions including but not limited to vaginal bleeding, contractions, leaking of fluid and fetal movement were reviewed in detail with the patient. Please refer to After Visit Summary for other counseling recommendations.  Return in about 2 weeks (around 11/16/2017) for Routine OB.   Judeth HornErin Mida Cory, NP

## 2017-11-02 NOTE — Patient Instructions (Signed)
Vaginal Birth After Cesarean Delivery Vaginal birth after cesarean delivery (VBAC) is giving birth vaginally after previously delivering a baby by a cesarean. In the past, if a woman had a cesarean delivery, all births afterward would be done by cesarean delivery. This is no longer true. It can be safe for the mother to try a vaginal delivery after having a cesarean delivery. It is important to discuss VBAC with your health care provider early in the pregnancy so you can understand the risks, benefits, and options. It will give you time to decide what is best in your particular case. The final decision about whether to have a VBAC or repeat cesarean delivery should be between you and your health care provider. Any changes in your health or your baby's health during your pregnancy may make it necessary to change your initial decision about VBAC. Women who plan to have a VBAC should check with their health care provider to be sure that:  The previous cesarean delivery was done with a low transverse uterine cut (incision) (not a vertical classical incision).  The birth canal is big enough for the baby.  There were no other operations on the uterus.  An electronic fetal monitor (EFM) will be on at all times during labor.  An operating room will be available and ready in case an emergency cesarean delivery is needed.  A health care provider and surgical nursing staff will be available at all times during labor to be ready to do an emergency delivery cesarean if necessary.  An anesthesiologist will be present in case an emergency cesarean delivery is needed.  The nursery is prepared and has adequate personnel and necessary equipment available to care for the baby in case of an emergency cesarean delivery. Benefits of VBAC  Shorter stay in the hospital.  Avoidance of risks associated with cesarean delivery, such as: ? Surgical complications, such as opening of the incision or hernia in the  incision. ? Injury to other organs. ? Fever. This can occur if an infection develops after surgery. It can also occur as a reaction to the medicine given to make you numb during the surgery.  Less blood loss and need for blood transfusions.  Lower risk of blood clots and infection.  Shorter recovery.  Decreased risk for having to remove the uterus (hysterectomy).  Decreased risk for the placenta to completely or partially cover the opening of the uterus (placenta previa) with a future pregnancy.  Decrease risk in future labor and delivery. Risks of a VBAC  Tearing (rupture) of the uterus. This is occurs in less than 1% of VBACs. The risk of this happening is higher if: ? Steps are taken to begin the labor process (induce labor) or stimulate or strengthen contractions (augment labor). ? Medicine is used to soften (ripen) the cervix.  Having to remove the uterus (hysterectomy) if it ruptures. VBAC should not be done if:  The previous cesarean delivery was done with a vertical (classical) or T-shaped incision or you do not know what kind of incision was made.  You had a ruptured uterus.  You have had certain types of surgery on your uterus, such as removal of uterine fibroids. Ask your health care provider about other types of surgeries that prevent you from having a VBAC.  You have certain medical or childbirth (obstetrical) problems.  There are problems with the baby.  You have had two previous cesarean deliveries and no vaginal deliveries. Other facts to know about VBAC:  It   is safe to have an epidural anesthetic with VBAC.  It is safe to turn the baby from a breech position (attempt an external cephalic version).  It is safe to try a VBAC with twins.  VBAC may not be successful if your baby weights 8.8 lb (4 kg) or more. However, weight predictions are not always accurate and should not be used alone to decide if VBAC is right for you.  There is an increased failure rate  if the time between the cesarean delivery and VBAC is less than 19 months.  Your health care provider may advise against a VBAC if you have preeclampsia (high blood pressure, protein in the urine, and swelling of face and extremities).  VBAC is often successful if you previously gave birth vaginally.  VBAC is often successful when the labor starts spontaneously before the due date.  Delivering a baby through a VBAC is similar to having a normal spontaneous vaginal delivery. This information is not intended to replace advice given to you by your health care provider. Make sure you discuss any questions you have with your health care provider. Document Released: 05/09/2007 Document Revised: 04/23/2016 Document Reviewed: 06/15/2013 Elsevier Interactive Patient Education  2018 [employer]  

## 2017-11-02 NOTE — Progress Notes (Signed)
[employer] interpreter Ammar [phone]

## 2017-11-04 ENCOUNTER — Other Ambulatory Visit: Payer: Medicaid Other

## 2017-11-16 ENCOUNTER — Ambulatory Visit (INDEPENDENT_AMBULATORY_CARE_PROVIDER_SITE_OTHER): Payer: Medicaid Other | Admitting: Advanced Practice Midwife

## 2017-11-16 ENCOUNTER — Encounter: Payer: Self-pay | Admitting: Advanced Practice Midwife

## 2017-11-16 VITALS — BP 107/67 | HR 79 | Wt 134.0 lb

## 2017-11-16 DIAGNOSIS — O99012 Anemia complicating pregnancy, second trimester: Secondary | ICD-10-CM

## 2017-11-16 DIAGNOSIS — Z3480 Encounter for supervision of other normal pregnancy, unspecified trimester: Secondary | ICD-10-CM

## 2017-11-16 DIAGNOSIS — O34219 Maternal care for unspecified type scar from previous cesarean delivery: Secondary | ICD-10-CM

## 2017-11-16 DIAGNOSIS — O26842 Uterine size-date discrepancy, second trimester: Secondary | ICD-10-CM

## 2017-11-16 NOTE — Progress Notes (Signed)
   PRENATAL VISIT NOTE  Subjective:  Claudia Lucas is a 21 y.o. G2P1001 at 4223w2d being seen today for ongoing prenatal care.  She is currently monitored for the following issues for this low-risk pregnancy and has Previous cesarean delivery, delivered; Supervision of other normal pregnancy, antepartum; Anemia of mother in pregnancy; Cough; and Language barrier on their problem list.  Patient reports no complaints.  Contractions: Not present. Vag. Bleeding: None.  Movement: Present. Denies leaking of fluid.   The following portions of the patient's history were reviewed and updated as appropriate: allergies, current medications, past family history, past medical history, past social history, past surgical history and problem list. Problem list updated.  Objective:   Vitals:   11/16/17 0953  BP: 107/67  Pulse: 79  Weight: 134 lb (60.8 kg)    Fetal Status: Fetal Heart Rate (bpm): 150 Fundal Height: 29 cm Movement: Present     General:  Alert, oriented and cooperative. Patient is in no acute distress.  Skin: Skin is warm and dry. No rash noted.   Cardiovascular: Normal heart rate noted  Respiratory: Normal respiratory effort, no problems with respiration noted  Abdomen: Soft, gravid, appropriate for gestational age.  Pain/Pressure: Absent     Pelvic: Cervical exam deferred        Extremities: Normal range of motion.  Edema: None  Mental Status:  Normal mood and affect. Normal behavior. Normal judgment and thought content.   Assessment and Plan:  Pregnancy: G2P1001 at 9923w2d  1. Uterine size date discrepancy pregnancy, second trimester --Size less than dates last visit but wnl today at 29 cm.  2. Supervision of other normal pregnancy, antepartum --Needs 2 hour GTT this week, will schedule, then next prenatal visit in 2-3 weeks. --Anticipatory guidance about next visits.  3. Previous cesarean delivery, delivered --Desires TOLAC, needs consent  4. Anemia during pregnancy in second  trimester --Hgb 11.4 on 08/10/17, routine care at this time, pt taking PNV.  Preterm labor symptoms and general obstetric precautions including but not limited to vaginal bleeding, contractions, leaking of fluid and fetal movement were reviewed in detail with the patient. Please refer to After Visit Summary for other counseling recommendations.  No Follow-up on file.   Sharen CounterLisa Leftwich-Kirby, CNM

## 2017-11-16 NOTE — Patient Instructions (Signed)

## 2017-11-19 ENCOUNTER — Other Ambulatory Visit: Payer: Medicaid Other

## 2017-11-19 DIAGNOSIS — Z3480 Encounter for supervision of other normal pregnancy, unspecified trimester: Secondary | ICD-10-CM

## 2017-11-20 LAB — GLUCOSE TOLERANCE, 2 HOURS W/ 1HR
GLUCOSE, 1 HOUR: 137 mg/dL (ref 65–179)
GLUCOSE, 2 HOUR: 106 mg/dL (ref 65–152)
GLUCOSE, FASTING: 83 mg/dL (ref 65–91)

## 2017-11-30 NOTE — L&D Delivery Note (Signed)
Patient is 22 y.o. G2P1001 5541w0d admitted for IOL for postterm. S/p IOL with Pitocin. AROM at 1005.  Prenatal course also complicated by prior c-section x1.  Delivery Note At 10:07 AM a viable female was delivered via VBAC, Spontaneous (Presentation: LOA).  APGAR: 8, 9; weight pending.   Placenta status: Intact.  Cord: 3V with the following complications: None.  Cord pH: N/A  Anesthesia: Epidural  Episiotomy: None Lacerations: 1st degree Perineal; Periurethral Suture Repair: 3.0 vicryl, 4.0 monocryl Est. Blood Loss (mL): 350   Mom to postpartum.  Baby to Couplet care / Skin to Skin.  Claudia AdaJazma Cloie Wooden, DO 02/06/2018, 10:39 AM

## 2017-12-08 ENCOUNTER — Ambulatory Visit (INDEPENDENT_AMBULATORY_CARE_PROVIDER_SITE_OTHER): Payer: Medicaid Other | Admitting: Certified Nurse Midwife

## 2017-12-08 ENCOUNTER — Encounter: Payer: Self-pay | Admitting: Certified Nurse Midwife

## 2017-12-08 VITALS — BP 97/55 | HR 87 | Wt 135.1 lb

## 2017-12-08 DIAGNOSIS — Z3483 Encounter for supervision of other normal pregnancy, third trimester: Secondary | ICD-10-CM | POA: Diagnosis not present

## 2017-12-08 DIAGNOSIS — O34219 Maternal care for unspecified type scar from previous cesarean delivery: Secondary | ICD-10-CM

## 2017-12-08 DIAGNOSIS — Z23 Encounter for immunization: Secondary | ICD-10-CM | POA: Diagnosis not present

## 2017-12-08 DIAGNOSIS — O99013 Anemia complicating pregnancy, third trimester: Secondary | ICD-10-CM | POA: Diagnosis not present

## 2017-12-08 DIAGNOSIS — O99012 Anemia complicating pregnancy, second trimester: Secondary | ICD-10-CM

## 2017-12-08 DIAGNOSIS — Z3480 Encounter for supervision of other normal pregnancy, unspecified trimester: Secondary | ICD-10-CM

## 2017-12-08 MED ORDER — PREPLUS 27-1 MG PO TABS: 1.0000 | ORAL_TABLET | Freq: Every day | ORAL | 6 refills | Status: DC

## 2017-12-08 NOTE — Progress Notes (Signed)
  Pt states is not taking any Prenatal Vitamins, encouraged her to start.Pt states will get them over the counter.Pt verbalized understanding.Tdap given.

## 2017-12-08 NOTE — Progress Notes (Signed)
   PRENATAL VISIT NOTE  Subjective:  Claudia Lucas is a 22 y.o. G3P1001 at 6039w3d being seen today for ongoing prenatal care.  She is currently monitored for the following issues for this low-risk pregnancy and has Previous cesarean delivery, delivered; Supervision of other normal pregnancy, antepartum; Anemia of mother in pregnancy; Cough; and Language barrier on their problem list.  Patient reports no complaints.  Contractions: Not present. Vag. Bleeding: None.  Movement: Present. Denies leaking of fluid.   The following portions of the patient's history were reviewed and updated as appropriate: allergies, current medications, past family history, past medical history, past social history, past surgical history and problem list. Problem list updated.  Objective:   Vitals:   12/08/17 0908  BP: (!) 97/55  Pulse: 87  Weight: 135 lb 1.6 oz (61.3 kg)    Fetal Status: Fetal Heart Rate (bpm): 142 Fundal Height: 31 cm Movement: Present     General:  Alert, oriented and cooperative. Patient is in no acute distress.  Skin: Skin is warm and dry. No rash noted.   Cardiovascular: Normal heart rate noted  Respiratory: Normal respiratory effort, no problems with respiration noted  Abdomen: Soft, gravid, appropriate for gestational age.  Pain/Pressure: Absent     Pelvic: Cervical exam deferred        Extremities: Normal range of motion.  Edema: None  Mental Status:  Normal mood and affect. Normal behavior. Normal judgment and thought content.   Assessment and Plan:  Pregnancy: G3P1001 at 8639w3d  1. Supervision of other normal pregnancy, antepartum -Previous size vs dates discrepancy, appropriate growth today with patient measuring 31cm.  -2 hr GTT completed on 12/21- results pending  - Tdap vaccine greater than or equal to 7yo IM - Prenatal Vit-Fe Fumarate-FA (PREPLUS) 27-1 MG TABS; Take 1 tablet by mouth daily.  Dispense: 30 tablet; Refill: 6  2. Previous cesarean delivery,  delivered -Desires TOLAC, needs consent signed   3. Anemia during pregnancy in second trimester -Stable, currently taking iron a prescribed.   Preterm labor symptoms and general obstetric precautions including but not limited to vaginal bleeding, contractions, leaking of fluid and fetal movement were reviewed in detail with the patient. Please refer to After Visit Summary for other counseling recommendations.  Return in about 2 weeks (around 12/22/2017) for ROB.   Sharyon CableVeronica C Raisha Brabender, CNM

## 2017-12-27 ENCOUNTER — Encounter: Payer: Self-pay | Admitting: Student

## 2017-12-28 ENCOUNTER — Encounter: Payer: Self-pay | Admitting: Student

## 2017-12-28 ENCOUNTER — Encounter: Payer: Self-pay | Admitting: *Deleted

## 2017-12-28 ENCOUNTER — Ambulatory Visit (INDEPENDENT_AMBULATORY_CARE_PROVIDER_SITE_OTHER): Payer: Medicaid Other | Admitting: Student

## 2017-12-28 VITALS — BP 111/67 | HR 82 | Wt 140.2 lb

## 2017-12-28 DIAGNOSIS — Z3483 Encounter for supervision of other normal pregnancy, third trimester: Secondary | ICD-10-CM

## 2017-12-28 DIAGNOSIS — Z789 Other specified health status: Secondary | ICD-10-CM

## 2017-12-28 DIAGNOSIS — Z3480 Encounter for supervision of other normal pregnancy, unspecified trimester: Secondary | ICD-10-CM

## 2017-12-28 DIAGNOSIS — O34219 Maternal care for unspecified type scar from previous cesarean delivery: Secondary | ICD-10-CM

## 2017-12-28 NOTE — Progress Notes (Signed)
   PRENATAL VISIT NOTE  Subjective:  Claudia Lucas is a 22 y.o. G2P1001 at 7850w2d being seen today for ongoing prenatal care.  She is currently monitored for the following issues for this high-risk pregnancy and has Previous cesarean delivery, antepartum; Supervision of other normal pregnancy, antepartum; Anemia of mother in pregnancy; Cough; and Language barrier on their problem list.  Patient reports no complaints.  Contractions: Not present. Vag. Bleeding: None.  Movement: Present. Denies leaking of fluid.   The following portions of the patient's history were reviewed and updated as appropriate: allergies, current medications, past family history, past medical history, past social history, past surgical history and problem list. Problem list updated.  Objective:   Vitals:   12/28/17 0903  BP: 111/67  Pulse: 82  Weight: 140 lb 3.2 oz (63.6 kg)    Fetal Status: Fetal Heart Rate (bpm): 135 Fundal Height: 34 cm Movement: Present     General:  Alert, oriented and cooperative. Patient is in no acute distress.  Skin: Skin is warm and dry. No rash noted.   Cardiovascular: Normal heart rate noted  Respiratory: Normal respiratory effort, no problems with respiration noted  Abdomen: Soft, gravid, appropriate for gestational age.  Pain/Pressure: Absent     Pelvic: Cervical exam deferred        Extremities: Normal range of motion.  Edema: None  Mental Status:  Normal mood and affect. Normal behavior. Normal judgment and thought content.   Assessment and Plan:  Pregnancy: G2P1001 at 5650w2d  1. Supervision of other normal pregnancy, antepartum -doing well. GBS next visit  2. Language barrier Arabic video interpreter   3. Previous cesarean delivery, antepartum TOLAC paperwork signed  Preterm labor symptoms and general obstetric precautions including but not limited to vaginal bleeding, contractions, leaking of fluid and fetal movement were reviewed in detail with the patient. Please  refer to After Visit Summary for other counseling recommendations.  Return in about 1 week (around 01/04/2018) for Routine OB.   Judeth HornErin Perle Gibbon, NP

## 2017-12-28 NOTE — Progress Notes (Signed)
[employer] interpreter Zena [phone]

## 2017-12-28 NOTE — Patient Instructions (Signed)
Vaginal Birth After Cesarean Delivery Vaginal birth after cesarean delivery (VBAC) is giving birth vaginally after previously delivering a baby by a cesarean. In the past, if a woman had a cesarean delivery, all births afterward would be done by cesarean delivery. This is no longer true. It can be safe for the mother to try a vaginal delivery after having a cesarean delivery. It is important to discuss VBAC with your health care provider early in the pregnancy so you can understand the risks, benefits, and options. It will give you time to decide what is best in your particular case. The final decision about whether to have a VBAC or repeat cesarean delivery should be between you and your health care provider. Any changes in your health or your baby's health during your pregnancy may make it necessary to change your initial decision about VBAC. Women who plan to have a VBAC should check with their health care provider to be sure that:  The previous cesarean delivery was done with a low transverse uterine cut (incision) (not a vertical classical incision).  The birth canal is big enough for the baby.  There were no other operations on the uterus.  An electronic fetal monitor (EFM) will be on at all times during labor.  An operating room will be available and ready in case an emergency cesarean delivery is needed.  A health care provider and surgical nursing staff will be available at all times during labor to be ready to do an emergency delivery cesarean if necessary.  An anesthesiologist will be present in case an emergency cesarean delivery is needed.  The nursery is prepared and has adequate personnel and necessary equipment available to care for the baby in case of an emergency cesarean delivery. Benefits of VBAC  Shorter stay in the hospital.  Avoidance of risks associated with cesarean delivery, such as: ? Surgical complications, such as opening of the incision or hernia in the  incision. ? Injury to other organs. ? Fever. This can occur if an infection develops after surgery. It can also occur as a reaction to the medicine given to make you numb during the surgery.  Less blood loss and need for blood transfusions.  Lower risk of blood clots and infection.  Shorter recovery.  Decreased risk for having to remove the uterus (hysterectomy).  Decreased risk for the placenta to completely or partially cover the opening of the uterus (placenta previa) with a future pregnancy.  Decrease risk in future labor and delivery. Risks of a VBAC  Tearing (rupture) of the uterus. This is occurs in less than 1% of VBACs. The risk of this happening is higher if: ? Steps are taken to begin the labor process (induce labor) or stimulate or strengthen contractions (augment labor). ? Medicine is used to soften (ripen) the cervix.  Having to remove the uterus (hysterectomy) if it ruptures. VBAC should not be done if:  The previous cesarean delivery was done with a vertical (classical) or T-shaped incision or you do not know what kind of incision was made.  You had a ruptured uterus.  You have had certain types of surgery on your uterus, such as removal of uterine fibroids. Ask your health care provider about other types of surgeries that prevent you from having a VBAC.  You have certain medical or childbirth (obstetrical) problems.  There are problems with the baby.  You have had two previous cesarean deliveries and no vaginal deliveries. Other facts to know about VBAC:  It   is safe to have an epidural anesthetic with VBAC.  It is safe to turn the baby from a breech position (attempt an external cephalic version).  It is safe to try a VBAC with twins.  VBAC may not be successful if your baby weights 8.8 lb (4 kg) or more. However, weight predictions are not always accurate and should not be used alone to decide if VBAC is right for you.  There is an increased failure rate  if the time between the cesarean delivery and VBAC is less than 19 months.  Your health care provider may advise against a VBAC if you have preeclampsia (high blood pressure, protein in the urine, and swelling of face and extremities).  VBAC is often successful if you previously gave birth vaginally.  VBAC is often successful when the labor starts spontaneously before the due date.  Delivering a baby through a VBAC is similar to having a normal spontaneous vaginal delivery. This information is not intended to replace advice given to you by your health care provider. Make sure you discuss any questions you have with your health care provider. Document Released: 05/09/2007 Document Revised: 04/23/2016 Document Reviewed: 06/15/2013 Elsevier Interactive Patient Education  2018 [employer]  

## 2018-01-14 ENCOUNTER — Ambulatory Visit (INDEPENDENT_AMBULATORY_CARE_PROVIDER_SITE_OTHER): Payer: Medicaid Other | Admitting: Student

## 2018-01-14 ENCOUNTER — Other Ambulatory Visit (HOSPITAL_COMMUNITY)
Admission: RE | Admit: 2018-01-14 | Discharge: 2018-01-14 | Disposition: A | Discharge status: Home / Self Care | Payer: Medicaid Other | Source: Ambulatory Visit | Attending: Student | Admitting: Student

## 2018-01-14 VITALS — BP 124/78 | HR 80 | Wt 142.3 lb

## 2018-01-14 DIAGNOSIS — Z3480 Encounter for supervision of other normal pregnancy, unspecified trimester: Secondary | ICD-10-CM

## 2018-01-14 DIAGNOSIS — Z3483 Encounter for supervision of other normal pregnancy, third trimester: Secondary | ICD-10-CM

## 2018-01-14 NOTE — Progress Notes (Signed)
Vertex presentation confirmed by bedside US today.

## 2018-01-14 NOTE — Patient Instructions (Signed)
Breastfeeding Challenges and Solutions  Even though breastfeeding is natural, it can be challenging, especially in the first few weeks after childbirth. It is normal for problems to arise when starting to breastfeed your new baby, even if you have breastfed before. This document provides some solutions to the most common breastfeeding challenges.  Challenges and solutions  Challenge--Cracked or Sore Nipples  Cracked or sore nipples are commonly experienced by breastfeeding mothers. Cracked or sore nipples often are caused by inadequate latching (when your baby's mouth attaches to your breast to breastfeed). Soreness can also happen if your baby is not positioned properly at your breast. Although nipple cracking and soreness are common during the first week after birth, nipple pain is never normal. If you experience nipple cracking or soreness that lasts longer than 1 week or nipple pain, call your health care provider or lactation consultant.  Solution  Ensure proper latching and positioning of your baby by following the steps below:  · Find a comfortable place to sit or lie down, with your neck and back well supported.  · Place a pillow or rolled up blanket under your baby to bring him or her to the level of your breast (if you are seated).  · Make sure that your baby's abdomen is facing your abdomen.  · Gently massage your breast. With your fingertips, massage from your chest wall toward your nipple in a circular motion. This encourages milk flow. You may need to continue this action during the feeding if your milk flows slowly.  · Support your breast with 4 fingers underneath and your thumb above your nipple. Make sure your fingers are well away from your nipple and your baby’s mouth.  · Stroke your baby's lips gently with your finger or nipple.  · When your baby's mouth is open wide enough, quickly bring your baby to your breast, placing your entire nipple and as much of the colored area around your nipple  (areola) as possible into your baby's mouth.  ? More areola should be visible above your baby's upper lip than below the lower lip.  ? Your baby's tongue should be between his or her lower gum and your breast.  · Ensure that your baby's mouth is correctly positioned around your nipple (latched). Your baby's lips should create a seal on your breast and be turned out (everted).  · It is common for your baby to suck for about 2-3 minutes in order to start the flow of breast milk.    Signs that your baby has successfully latched on to your nipple include:  · Quietly tugging or quietly sucking without causing you pain.  · Swallowing heard between every 3-4 sucks.  · Muscle movement above and in front of his or her ears with sucking.    Signs that your baby has not successfully latched on to nipple include:  · Sucking sounds or smacking sounds from your baby while nursing.  · Nipple pain.    Ensure that your breasts stay moisturized and healthy by:  · Avoiding the use of soap on your nipples.  · Wearing a supportive bra. Avoid wearing underwire-style bras or tight bras.  · Air drying your nipples for 3-4 minutes after each feeding.  · Using only cotton bra pads to absorb breast milk leakage. Leaking of breast milk between feedings is normal. Be sure to change the pads if they become soaked with milk.  · Using lanolin on your nipples after nursing. Lanolin helps to maintain your   skin's normal moisture barrier. If you use pure lanolin you do not need to wash it off before feeding your baby again. Pure lanolin is not toxic to your baby. You may also hand express a few drops of breast milk and gently massage that milk into your nipples, allowing it to air dry.    Challenge--Breast Engorgement  Breast engorgement is the overfilling of your breasts with breast milk. In the first few weeks after giving birth, you may experience breast engorgement. Breast engorgement can make your breasts throb and feel hard, tightly stretched,  warm, and tender. Engorgement peaks about the fifth day after you give birth. Having breast engorgement does not mean you have to stop breastfeeding your baby.  Solution  · Breastfeed when you feel the need to reduce the fullness of your breasts or when your baby shows signs of hunger. This is called "breastfeeding on demand."  · Newborns (babies younger than 4 weeks) often breastfeed every 1-3 hours during the day. You may need to awaken your baby to feed if he or she is asleep at a feeding time.  · Do not allow your baby to sleep longer than 5 hours during the night without a feeding.  · Pump or hand express breast milk before breastfeeding to soften your breast, areola, and nipple.  · Apply warm, moist heat (in the shower or with warm water-soaked hand towels) just before feeding or pumping, or massage your breast before or during breastfeeding. This increases circulation and helps your milk to flow.  · Completely empty your breasts when breastfeeding or pumping. Afterward, wear a snug bra (nursing or regular) or tank top for 1-2 days to signal your body to slightly decrease milk production. Only wear snug bras or tank tops to treat engorgement. Tight bras typically should be avoided by breastfeeding mothers. Once engorgement is relieved, return to wearing regular, loose-fitting clothes.  · Apply ice packs to your breasts to lessen the pain from engorgement and relieve swelling, unless the ice is uncomfortable for you.  · Do not delay feedings. Try to relax when it is time to feed your baby. This helps to trigger your "let-down reflex," which releases milk from your breast.  · Ensure your baby is latched on to your breast and positioned properly while breastfeeding.  · Allow your baby to remain at your breast as long as he or she is latched on well and actively sucking. Your baby will let you know when he or she is done breastfeeding by pulling away from your breast or falling asleep.  · Avoid introducing bottles  or pacifiers to your baby in the early weeks of breastfeeding. Wait to introduce these things until after resolving any breastfeeding challenges.  · Try to pump your milk on the same schedule as when your baby would breastfeed if you are returning to work or away from home for an extended period.  · Drink plenty of fluids to avoid dehydration, which can eventually put you at greater risk of breast engorgement.    If you follow these suggestions, your engorgement should improve in 24-48 hours. If you are still experiencing difficulty, call your lactation consultant or health care provider.  Challenge--Plugged Milk Ducts  Plugged milk ducts occur when the duct does not drain milk effectively and becomes swollen. Wearing a tight-fitting nursing bra or having difficulty with latching may cause plugged milk ducts. Not drinking enough water (8-10 c [1.9-2.4 L] per day) can contribute to plugged milk ducts. Once a   duct has become plugged, hard lumps, soreness, and redness may develop in your breast.  Solution  Do not delay feedings. Feed your baby frequently and try to empty your breasts of milk at each feeding. Try breastfeeding from the affected side first so there is a better chance that the milk will drain completely from that breast. Apply warm, moist towels to your breasts for 5-10 minutes before feeding. Alternatively, a hot shower right before breastfeeding can provide the moist heat that can encourage milk flow. Gentle massage of the sore area before and during a feeding may also help. Avoid wearing tight clothing or bras that put pressure on your breasts. Wear bras that offer good support to your breasts, but avoid underwire bras. If you have a plugged milk duct and develop a fever, you need to see your health care provider.  Challenge--Mastitis  Mastitis is inflammation of your breast. It usually is caused by a bacterial infection and can cause flu-like symptoms. You may develop redness in your breast and a  fever. Often when mastitis occurs, your breast becomes firm, warm, and very painful. The most common causes of mastitis are poor latching, ineffective sucking from your baby, consistent pressure on your breast (possibly from wearing a tight-fitting bra or shirt that restricts the milk flow), unusual stress or fatigue, or missed feedings.  Solution  You will be given antibiotic medicine to treat the infection. It is still important to breastfeed frequently to empty your breasts. Continuing to breastfeed while you recover from mastitis will not harm your baby. Make sure your baby is positioned properly during every feeding. Apply moist heat to your breasts for a few minutes before feeding to help the milk flow and to help your breasts empty more easily.  Challenge--Thrush  Thrush is a yeast infection that can form on your nipples, in your breast, or in your baby's mouth. It causes itching, soreness, burning or stabbing pain, and sometimes a rash.  Solution  You will be given a medicated ointment for your nipples, and your baby will be given a liquid medicine for his or her mouth. It is important that you and your baby are treated at the same time because thrush can be passed between you and your baby. Change disposable nursing pads often. Any bras, towels, or clothing that come in contact with infected areas of your body or your baby's body need to be washed in very hot water every day. Wash your hands and your baby's hands often. All pacifiers, bottle nipples, or toys your baby puts in his or her mouth should be boiled once a day for 20 minutes. After 1 week of treatment, discard pacifiers and bottle nipples and buy new ones. All breast pump parts that touch the milk need to be boiled for 20 minutes every day.  Challenge--Low Milk Supply  You may not be producing enough milk if your baby is not gaining the proper amount of weight. Breast milk production is based on a supply-and-demand system. Your milk supply depends  on how frequently and effectively your baby empties your breast.  Solution  The more you breastfeed and pump, the more breast milk you will produce. It is important that your baby empties at least one of your breasts at each feeding. If this is not happening, then use a breast pump or hand express any milk that remains. This will help to drain as much milk as possible at each feeding. It will also signal your body to produce more   milk. If your baby is not emptying your breasts, it may be due to latching, sucking, or positioning problems. If low milk supply continues after addressing these issues, contact your health care provider or a lactation specialist as soon as possible.  Challenge--Inverted or Flat Nipples  Some women have nipples that turn inward instead of protruding outward. Other women have nipples that are flat. Inverted or flat nipples can sometimes make it more difficult for your baby to latch onto your breast.  Solution  You may be given a small device that pulls out inverted nipples. This device should be applied right before your baby is brought to your breast. You can also try using a breast pump for a short time before placing the baby at your breast. The pump can pull your nipple outwards to help your infant latch more easily. The baby's sucking motion will help the inverted nipple protrude as well.  If you have flat nipples, encourage your baby to latch onto your breast and feed frequently in the early days after birth. This will give your baby practice latching on correctly while your breast is still soft. When your milk supply increases, between the second and fifth day after birth and your breasts become full, your baby will have an easier time latching.  Contact a lactation consultant if you still have concerns. She or he can teach you additional techniques to address breastfeeding problems related to nipple shape and position.  Where to find more information:  [employer]:  www.llli.org  This information is not intended to replace advice given to you by your health care provider. Make sure you discuss any questions you have with your health care provider.  Document Released: 05/10/2006 Document Revised: 04/29/2016 Document Reviewed: 05/12/2013  Elsevier Interactive Patient Education © 2017 [employer]

## 2018-01-14 NOTE — Progress Notes (Signed)
Patient ID: Claudia Lucas, female   DOB: December 09, 1995, 22 y.o.   MRN: [id number]   PRENATAL VISIT NOTE  Subjective:  Claudia Lucas is a 22 y.o. G2P1001 at 2378w5d being seen today for ongoing prenatal care.  She is currently monitored for the following issues for this low-risk pregnancy and has Previous cesarean delivery, antepartum; Supervision of other normal pregnancy, antepartum; Anemia of mother in pregnancy; Cough; and Language barrier on their problem list.  She is not taking her iron pills anymore since she was told her hemoglobin was fine.   Patient reports no complaints.  Contractions: Not present. Vag. Bleeding: None.  Movement: Present. Denies leaking of fluid.   The following portions of the patient's history were reviewed and updated as appropriate: allergies, current medications, past family history, past medical history, past social history, past surgical history and problem list. Problem list updated.  Objective:   Vitals:   01/14/18 0948  BP: 124/78  Pulse: 80  Weight: 142 lb 4.8 oz (64.5 kg)    Fetal Status: Fetal Heart Rate (bpm): 135 Fundal Height: 35 cm Movement: Present  Presentation: Vertex  General:  Alert, oriented and cooperative. Patient is in no acute distress.  Skin: Skin is warm and dry. No rash noted.   Cardiovascular: Normal heart rate noted  Respiratory: Normal respiratory effort, no problems with respiration noted  Abdomen: Soft, gravid, appropriate for gestational age.  Pain/Pressure: Present     Pelvic: Cervical exam performed Dilation: Closed      Extremities: Normal range of motion.  Edema: None  Mental Status:  Normal mood and affect. Normal behavior. Normal judgment and thought content.   Assessment and Plan:  Pregnancy: G2P1001 at 10078w5d  1. Supervision of other normal pregnancy, antepartum -Confirmed vertex today by US - Culture, beta strep (group b only) - GC[facility]Chlamydia [facility]  Term labor symptoms and general  obstetric precautions including but not limited to vaginal bleeding, contractions, leaking of fluid and fetal movement were reviewed in detail with the patient. Please refer to After Visit Summary for other counseling recommendations.  Return in about 1 week (around 01/21/2018), or [facility].   Marylene LandKathryn Lorraine Kooistra, CNM

## 2018-01-17 LAB — GC/CHLAMYDIA PROBE AMP (~~LOC~~) NOT AT ARMC
CHLAMYDIA, DNA PROBE: NEGATIVE
NEISSERIA GONORRHEA: NEGATIVE

## 2018-01-17 LAB — CULTURE, BETA STREP (GROUP B ONLY): Strep Gp B Culture: POSITIVE — AB

## 2018-01-21 ENCOUNTER — Ambulatory Visit (INDEPENDENT_AMBULATORY_CARE_PROVIDER_SITE_OTHER): Payer: Medicaid Other | Admitting: Certified Nurse Midwife

## 2018-01-21 ENCOUNTER — Encounter: Payer: Self-pay | Admitting: Certified Nurse Midwife

## 2018-01-21 DIAGNOSIS — Z3480 Encounter for supervision of other normal pregnancy, unspecified trimester: Secondary | ICD-10-CM

## 2018-01-21 NOTE — Patient Instructions (Signed)
Braxton Hicks Contractions °Contractions of the uterus can occur throughout pregnancy, but they are not always a sign that you are in labor. You may have practice contractions called Braxton Hicks contractions. These false labor contractions are sometimes confused with true labor. °What are Braxton Hicks contractions? °Braxton Hicks contractions are tightening movements that occur in the muscles of the uterus before labor. Unlike true labor contractions, these contractions do not result in opening (dilation) and thinning of the cervix. Toward the end of pregnancy (32-34 weeks), Braxton Hicks contractions can happen more often and may become stronger. These contractions are sometimes difficult to tell apart from true labor because they can be very uncomfortable. You should not feel embarrassed if you go to the hospital with false labor. °Sometimes, the only way to tell if you are in true labor is for your health care provider to look for changes in the cervix. The health care provider will do a physical exam and may monitor your contractions. If you are not in true labor, the exam should show that your cervix is not dilating and your water has not broken. °If there are other health problems associated with your pregnancy, it is completely safe for you to be sent home with false labor. You may continue to have Braxton Hicks contractions until you go into true labor. °How to tell the difference between true labor and false labor °True labor °· Contractions last 30-70 seconds. °· Contractions become very regular. °· Discomfort is usually felt in the top of the uterus, and it spreads to the lower abdomen and low back. °· Contractions do not go away with walking. °· Contractions usually become more intense and increase in frequency. °· The cervix dilates and gets thinner. °False labor °· Contractions are usually shorter and not as strong as true labor contractions. °· Contractions are usually irregular. °· Contractions  are often felt in the front of the lower abdomen and in the groin. °· Contractions may go away when you walk around or change positions while lying down. °· Contractions get weaker and are shorter-lasting as time goes on. °· The cervix usually does not dilate or become thin. °Follow these instructions at home: °· Take over-the-counter and prescription medicines only as told by your health care provider. °· Keep up with your usual exercises and follow other instructions from your health care provider. °· Eat and drink lightly if you think you are going into labor. °· If Braxton Hicks contractions are making you uncomfortable: °? Change your position from lying down or resting to walking, or change from walking to resting. °? Sit and rest in a tub of warm water. °? Drink enough fluid to keep your urine pale yellow. Dehydration may cause these contractions. °? Do slow and deep breathing several times an hour. °· Keep all follow-up prenatal visits as told by your health care provider. This is important. °Contact a health care provider if: °· You have a fever. °· You have continuous pain in your abdomen. °Get help right away if: °· Your contractions become stronger, more regular, and closer together. °· You have fluid leaking or gushing from your vagina. °· You pass blood-tinged mucus (bloody show). °· You have bleeding from your vagina. °· You have low back pain that you never had before. °· You feel your baby’s head pushing down and causing pelvic pressure. °· Your baby is not moving inside you as much as it used to. °Summary °· Contractions that occur before labor are called Braxton   Hicks contractions, false labor, or practice contractions. °· Braxton Hicks contractions are usually shorter, weaker, farther apart, and less regular than true labor contractions. True labor contractions usually become progressively stronger and regular and they become more frequent. °· Manage discomfort from Braxton Hicks contractions by  changing position, resting in a warm bath, drinking plenty of water, or practicing deep breathing. °This information is not intended to replace advice given to you by your health care provider. Make sure you discuss any questions you have with your health care provider. °Document Released: 04/01/2017 Document Revised: 04/01/2017 Document Reviewed: 04/01/2017 °Elsevier Interactive Patient Education © 2018 [employer] Inc[employer] ° °

## 2018-01-21 NOTE — Progress Notes (Signed)
   PRENATAL VISIT NOTE  Subjective:  Claudia Lucas is a 22 y.o. G2P1001 at 6568w5d being seen today for ongoing prenatal care.  Claudia Lucas is currently monitored for the following issues for this low-risk pregnancy and has Previous cesarean delivery, antepartum; Supervision of other normal pregnancy, antepartum; Anemia of mother in pregnancy; Cough; and Language barrier on their problem list.  Patient reports occasional contractions.  Contractions: Not present. Vag. Bleeding: None.  Movement: Present. Denies leaking of fluid.   The following portions of the patient's history were reviewed and updated as appropriate: allergies, current medications, past family history, past medical history, past social history, past surgical history and problem list. Problem list updated. Arabic interpreter present at bedside.   Objective:   Vitals:   01/21/18 0857  BP: 107/69  Pulse: 79  Weight: 141 lb (64 kg)    Fetal Status: Fetal Heart Rate (bpm): 147 Fundal Height: 36 cm Movement: Present  Presentation: Vertex  General:  Alert, oriented and cooperative. Patient is in no acute distress.  Skin: Skin is warm and dry. No rash noted.   Cardiovascular: Normal heart rate noted  Respiratory: Normal respiratory effort, no problems with respiration noted  Abdomen: Soft, gravid, appropriate for gestational age.  Pain/Pressure: Absent     Pelvic: Cervical exam performed Dilation: 2 Effacement (%): 50 Station: -3  Extremities: Normal range of motion.  Edema: None  Mental Status:  Normal mood and affect. Normal behavior. Normal judgment and thought content.   Assessment and Plan:  Pregnancy: G2P1001 at 4268w5d  1. Supervision of other normal pregnancy, antepartum -Patient doing well, complains of contractions yesterday that lasted two hours- none today. Denies vaginal bleeding or leaking of fluid. -Discussed results of GBS screening with positive result, recommends antibiotics during labor for treatment. Patient  agrees to POC.   Term labor symptoms and general obstetric precautions including but not limited to vaginal bleeding, contractions, leaking of fluid and fetal movement were reviewed in detail with the patient. Please refer to After Visit Summary for other counseling recommendations.  Return in about 1 week (around 01/28/2018) for ROB.   Sharyon CableVeronica C Tayelor Osborne, CNM

## 2018-01-21 NOTE — Progress Notes (Signed)
Pt stated had contraction last night.

## 2018-01-28 ENCOUNTER — Encounter: Payer: Self-pay | Admitting: *Deleted

## 2018-01-28 ENCOUNTER — Ambulatory Visit (INDEPENDENT_AMBULATORY_CARE_PROVIDER_SITE_OTHER): Payer: Medicaid Other | Admitting: Obstetrics and Gynecology

## 2018-01-28 VITALS — BP 114/68 | HR 91 | Wt 143.1 lb

## 2018-01-28 DIAGNOSIS — O34219 Maternal care for unspecified type scar from previous cesarean delivery: Secondary | ICD-10-CM

## 2018-01-28 DIAGNOSIS — Z3480 Encounter for supervision of other normal pregnancy, unspecified trimester: Secondary | ICD-10-CM

## 2018-01-28 NOTE — Progress Notes (Signed)
   PRENATAL VISIT NOTE  Subjective:  Claudia Lucas is a 22 y.o. G2P1001 at 7633w5d being seen today for ongoing prenatal care.  She is currently monitored for the following issues for this low-risk pregnancy and has Previous cesarean delivery, antepartum; Supervision of other normal pregnancy, antepartum; Anemia of mother in pregnancy; Cough; and Language barrier on their problem list. Arabic interpreter here. Still desires TOLAC.  Patient reports no complaints and mild irregular UCs.  Contractions: Irregular. Vag. Bleeding: None.  Movement: Present. Denies leaking of fluid.   The following portions of the patient's history were reviewed and updated as appropriate: allergies, current medications, past family history, past medical history, past social history, past surgical history and problem list. Problem list updated.  Objective:   Vitals:   01/28/18 0934  BP: 114/68  Pulse: 91  Weight: 143 lb 1.6 oz (64.9 kg)    Fetal Status: Fetal Heart Rate (bpm): 151   Movement: Present     General:  Alert, oriented and cooperative. Patient is in no acute distress.  Skin: Skin is warm and dry. No rash noted.   Cardiovascular: Normal heart rate noted  Respiratory: Normal respiratory effort, no problems with respiration noted  Abdomen: Soft, gravid, appropriate for gestational age.  Pain/Pressure: Present     Pelvic: Cervical exam performed      loose 1/50/-3 cephalic  Extremities: Normal range of motion.  Edema: None  Mental Status:  Normal mood and affect. Normal behavior. Normal judgment and thought content.   Assessment and Plan:  Pregnancy: G2P1001 at 4133w5d  F/U with [facility] on 02/02/2018 and schedule IOL at 41 wks  There are no diagnoses linked to this encounter. Term labor symptoms and general obstetric precautions including but not limited to vaginal bleeding, contractions, leaking of fluid and fetal movement were reviewed in detail with the patient. Please refer to After Visit Summary for  other counseling recommendations.  Return in about 5 days (around 02/02/2018).   Claudia Lucas, CNM

## 2018-01-28 NOTE — Patient Instructions (Signed)
Third Trimester of Pregnancy The third trimester is from week 28 through week 40 (months 7 through 9). The third trimester is a time when the unborn baby (fetus) is growing rapidly. At the end of the ninth month, the fetus is about 20 inches in length and weighs 6-10 pounds. Body changes during your third trimester Your body will continue to go through many changes during pregnancy. The changes vary from woman to woman. During the third trimester:  Your weight will continue to increase. You can expect to gain 25-35 pounds (11-16 kg) by the end of the pregnancy.  You may begin to get stretch marks on your hips, abdomen, and breasts.  You may urinate more often because the fetus is moving lower into your pelvis and pressing on your bladder.  You may develop or continue to have heartburn. This is caused by increased hormones that slow down muscles in the digestive tract.  You may develop or continue to have constipation because increased hormones slow digestion and cause the muscles that push waste through your intestines to relax.  You may develop hemorrhoids. These are swollen veins (varicose veins) in the rectum that can itch or be painful.  You may develop swollen, bulging veins (varicose veins) in your legs.  You may have increased body aches in the pelvis, back, or thighs. This is due to weight gain and increased hormones that are relaxing your joints.  You may have changes in your hair. These can include thickening of your hair, rapid growth, and changes in texture. Some women also have hair loss during or after pregnancy, or hair that feels dry or thin. Your hair will most likely return to normal after your baby is born.  Your breasts will continue to grow and they will continue to become tender. A yellow fluid (colostrum) may leak from your breasts. This is the first milk you are producing for your baby.  Your belly button may stick out.  You may notice more swelling in your hands,  face, or ankles.  You may have increased tingling or numbness in your hands, arms, and legs. The skin on your belly may also feel numb.  You may feel short of breath because of your expanding uterus.  You may have more problems sleeping. This can be caused by the size of your belly, increased need to urinate, and an increase in your body's metabolism.  You may notice the fetus "dropping," or moving lower in your abdomen (lightening).  You may have increased vaginal discharge.  You may notice your joints feel loose and you may have pain around your pelvic bone.  What to expect at prenatal visits You will have prenatal exams every 2 weeks until week 36. Then you will have weekly prenatal exams. During a routine prenatal visit:  You will be weighed to make sure you and the baby are growing normally.  Your blood pressure will be taken.  Your abdomen will be measured to track your baby's growth.  The fetal heartbeat will be listened to.  Any test results from the previous visit will be discussed.  You may have a cervical check near your due date to see if your cervix has softened or thinned (effaced).  You will be tested for Group B streptococcus. This happens between 35 and 37 weeks.  Your health care provider may ask you:  What your birth plan is.  How you are feeling.  If you are feeling the baby move.  If you have had   any abnormal symptoms, such as leaking fluid, bleeding, severe headaches, or abdominal cramping.  If you are using any tobacco products, including cigarettes, chewing tobacco, and electronic cigarettes.  If you have any questions.  Other tests or screenings that may be performed during your third trimester include:  Blood tests that check for low iron levels (anemia).  Fetal testing to check the health, activity level, and growth of the fetus. Testing is done if you have certain medical conditions or if there are problems during the  pregnancy.  Nonstress test (NST). This test checks the health of your baby to make sure there are no signs of problems, such as the baby not getting enough oxygen. During this test, a belt is placed around your belly. The baby is made to move, and its heart rate is monitored during movement.  What is false labor? False labor is a condition in which you feel small, irregular tightenings of the muscles in the womb (contractions) that usually go away with rest, changing position, or drinking water. These are called Braxton Hicks contractions. Contractions may last for hours, days, or even weeks before true labor sets in. If contractions come at regular intervals, become more frequent, increase in intensity, or become painful, you should see your health care provider. What are the signs of labor?  Abdominal cramps.  Regular contractions that start at 10 minutes apart and become stronger and more frequent with time.  Contractions that start on the top of the uterus and spread down to the lower abdomen and back.  Increased pelvic pressure and dull back pain.  A watery or bloody mucus discharge that comes from the vagina.  Leaking of amniotic fluid. This is also known as your "water breaking." It could be a slow trickle or a gush. Let your health care provider know if it has a color or strange odor. If you have any of these signs, call your health care provider right away, even if it is before your due date. Follow these instructions at home: Medicines  Follow your health care provider's instructions regarding medicine use. Specific medicines may be either safe or unsafe to take during pregnancy.  Take a prenatal vitamin that contains at least 600 micrograms (mcg) of folic acid.  If you develop constipation, try taking a stool softener if your health care provider approves. Eating and drinking  Eat a balanced diet that includes fresh fruits and vegetables, whole grains, good sources of protein  such as meat, eggs, or tofu, and low-fat dairy. Your health care provider will help you determine the amount of weight gain that is right for you.  Avoid raw meat and uncooked cheese. These carry germs that can cause birth defects in the baby.  If you have low calcium intake from food, talk to your health care provider about whether you should take a daily calcium supplement.  Eat four or five small meals rather than three large meals a day.  Limit foods that are high in fat and processed sugars, such as fried and sweet foods.  To prevent constipation: ? Drink enough fluid to keep your urine clear or pale yellow. ? Eat foods that are high in fiber, such as fresh fruits and vegetables, whole grains, and beans. Activity  Exercise only as directed by your health care provider. Most women can continue their usual exercise routine during pregnancy. Try to exercise for 30 minutes at least 5 days a week. Stop exercising if you experience uterine contractions.  Avoid heavy   lifting.  Do not exercise in extreme heat or humidity, or at high altitudes.  Wear low-heel, comfortable shoes.  Practice good posture.  You may continue to have sex unless your health care provider tells you otherwise. Relieving pain and discomfort  Take frequent breaks and rest with your legs elevated if you have leg cramps or low back pain.  Take warm sitz baths to soothe any pain or discomfort caused by hemorrhoids. Use hemorrhoid cream if your health care provider approves.  Wear a good support bra to prevent discomfort from breast tenderness.  If you develop varicose veins: ? Wear support pantyhose or compression stockings as told by your healthcare provider. ? Elevate your feet for 15 minutes, 3-4 times a day. Prenatal care  Write down your questions. Take them to your prenatal visits.  Keep all your prenatal visits as told by your health care provider. This is important. Safety  Wear your seat belt at  all times when driving.  Make a list of emergency phone numbers, including numbers for family, friends, the hospital, and police and fire departments. General instructions  Avoid cat litter boxes and soil used by cats. These carry germs that can cause birth defects in the baby. If you have a cat, ask someone to clean the litter box for you.  Do not travel far distances unless it is absolutely necessary and only with the approval of your health care provider.  Do not use hot tubs, steam rooms, or saunas.  Do not drink alcohol.  Do not use any products that contain nicotine or tobacco, such as cigarettes and e-cigarettes. If you need help quitting, ask your health care provider.  Do not use any medicinal herbs or unprescribed drugs. These chemicals affect the formation and growth of the baby.  Do not douche or use tampons or scented sanitary pads.  Do not cross your legs for long periods of time.  To prepare for the arrival of your baby: ? Take prenatal classes to understand, practice, and ask questions about labor and delivery. ? Make a trial run to the hospital. ? Visit the hospital and tour the maternity area. ? Arrange for maternity or paternity leave through employers. ? Arrange for family and friends to take care of pets while you are in the hospital. ? Purchase a rear-facing car seat and make sure you know how to install it in your car. ? Pack your hospital bag. ? Prepare the baby's nursery. Make sure to remove all pillows and stuffed animals from the baby's crib to prevent suffocation.  Visit your dentist if you have not gone during your pregnancy. Use a soft toothbrush to brush your teeth and be gentle when you floss. Contact a health care provider if:  You are unsure if you are in labor or if your water has broken.  You become dizzy.  You have mild pelvic cramps, pelvic pressure, or nagging pain in your abdominal area.  You have lower back pain.  You have persistent  nausea, vomiting, or diarrhea.  You have an unusual or bad smelling vaginal discharge.  You have pain when you urinate. Get help right away if:  Your water breaks before 37 weeks.  You have regular contractions less than 5 minutes apart before 37 weeks.  You have a fever.  You are leaking fluid from your vagina.  You have spotting or bleeding from your vagina.  You have severe abdominal pain or cramping.  You have rapid weight loss or weight gain.    You have shortness of breath with chest pain.  You notice sudden or extreme swelling of your face, hands, ankles, feet, or legs.  Your baby makes fewer than 10 movements in 2 hours.  You have severe headaches that do not go away when you take medicine.  You have vision changes. Summary  The third trimester is from week 28 through week 40, months 7 through 9. The third trimester is a time when the unborn baby (fetus) is growing rapidly.  During the third trimester, your discomfort may increase as you and your baby continue to gain weight. You may have abdominal, leg, and back pain, sleeping problems, and an increased need to urinate.  During the third trimester your breasts will keep growing and they will continue to become tender. A yellow fluid (colostrum) may leak from your breasts. This is the first milk you are producing for your baby.  False labor is a condition in which you feel small, irregular tightenings of the muscles in the womb (contractions) that eventually go away. These are called Braxton Hicks contractions. Contractions may last for hours, days, or even weeks before true labor sets in.  Signs of labor can include: abdominal cramps; regular contractions that start at 10 minutes apart and become stronger and more frequent with time; watery or bloody mucus discharge that comes from the vagina; increased pelvic pressure and dull back pain; and leaking of amniotic fluid. This information is not intended to replace advice  given to you by your health care provider. Make sure you discuss any questions you have with your health care provider. Document Released: 11/10/2001 Document Revised: 04/23/2016 Document Reviewed: 01/17/2013 Elsevier Interactive Patient Education  2017 Elsevier Inc[employer]  

## 2018-01-30 ENCOUNTER — Other Ambulatory Visit (HOSPITAL_COMMUNITY): Payer: Self-pay | Admitting: Advanced Practice Midwife

## 2018-01-31 ENCOUNTER — Telehealth (HOSPITAL_COMMUNITY): Payer: Self-pay | Admitting: *Deleted

## 2018-01-31 NOTE — Telephone Encounter (Signed)
Preadmission screen interpreter number [phone]

## 2018-02-04 ENCOUNTER — Ambulatory Visit (INDEPENDENT_AMBULATORY_CARE_PROVIDER_SITE_OTHER): Payer: Medicaid Other | Admitting: *Deleted

## 2018-02-04 VITALS — BP 109/68 | HR 79

## 2018-02-04 DIAGNOSIS — O480 Post-term pregnancy: Secondary | ICD-10-CM

## 2018-02-04 NOTE — Progress Notes (Signed)
Interpreter Sarah present for encounter. IOL is scheduled on 3/10. Pt advised to return to hospital for sx of labor or decreased fetal movement.

## 2018-02-04 NOTE — Progress Notes (Signed)
NST:  Baseline: 135 bpm, Variability: Good {> 6 bpm), Accelerations: Reactive and Decelerations: Absent   

## 2018-02-06 ENCOUNTER — Inpatient Hospital Stay (HOSPITAL_COMMUNITY): Payer: Medicaid Other | Admitting: Anesthesiology

## 2018-02-06 ENCOUNTER — Encounter (HOSPITAL_COMMUNITY): Payer: Self-pay

## 2018-02-06 ENCOUNTER — Other Ambulatory Visit: Payer: Self-pay

## 2018-02-06 ENCOUNTER — Inpatient Hospital Stay (HOSPITAL_COMMUNITY)
Admission: RE | Admit: 2018-02-06 | Discharge: 2018-02-08 | DRG: 807 | Disposition: A | Discharge status: Home / Self Care | Payer: Medicaid Other | Source: Ambulatory Visit | Attending: Obstetrics and Gynecology | Admitting: Obstetrics and Gynecology

## 2018-02-06 DIAGNOSIS — D649 Anemia, unspecified: Secondary | ICD-10-CM | POA: Diagnosis present

## 2018-02-06 DIAGNOSIS — Z3A41 41 weeks gestation of pregnancy: Secondary | ICD-10-CM

## 2018-02-06 DIAGNOSIS — O480 Post-term pregnancy: Principal | ICD-10-CM | POA: Diagnosis present

## 2018-02-06 DIAGNOSIS — O9902 Anemia complicating childbirth: Secondary | ICD-10-CM | POA: Diagnosis present

## 2018-02-06 DIAGNOSIS — O34219 Maternal care for unspecified type scar from previous cesarean delivery: Secondary | ICD-10-CM

## 2018-02-06 DIAGNOSIS — O99824 Streptococcus B carrier state complicating childbirth: Secondary | ICD-10-CM | POA: Diagnosis present

## 2018-02-06 LAB — CBC
HCT: 33.1 % — ABNORMAL LOW (ref 36.0–46.0)
Hemoglobin: 11.8 g/dL — ABNORMAL LOW (ref 12.0–15.0)
MCH: 33 pg (ref 26.0–34.0)
MCHC: 35.6 g/dL (ref 30.0–36.0)
MCV: 92.5 fL (ref 78.0–100.0)
PLATELETS: 203 10*3/uL (ref 150–400)
RBC: 3.58 MIL/uL — AB (ref 3.87–5.11)
RDW: 12.7 % (ref 11.5–15.5)
WBC: 6.6 10*3/uL (ref 4.0–10.5)

## 2018-02-06 LAB — RPR: RPR: NONREACTIVE

## 2018-02-06 LAB — TYPE AND SCREEN
ABO/RH(D): B POS
Antibody Screen: NEGATIVE

## 2018-02-06 MED ORDER — TERBUTALINE SULFATE 1 MG/ML IJ SOLN
0.2500 mg | Freq: Once | INTRAMUSCULAR | Status: DC | PRN
  Filled 2018-02-06: qty 1

## 2018-02-06 MED ORDER — PHENYLEPHRINE 40 MCG/ML (10ML) SYRINGE FOR IV PUSH (FOR BLOOD PRESSURE SUPPORT)
80.0000 ug | PREFILLED_SYRINGE | INTRAVENOUS | Status: DC | PRN
  Administered 2018-02-06: 80 ug via INTRAVENOUS
  Filled 2018-02-06: qty 5

## 2018-02-06 MED ORDER — LACTATED RINGERS IV SOLN
INTRAVENOUS | Status: DC
  Administered 2018-02-06: 09:00:00 via INTRAVENOUS

## 2018-02-06 MED ORDER — ZOLPIDEM TARTRATE 5 MG PO TABS: 5.0000 mg | ORAL_TABLET | Freq: Every evening | ORAL | Status: DC | PRN

## 2018-02-06 MED ORDER — IBUPROFEN 600 MG PO TABS
600.0000 mg | ORAL_TABLET | Freq: Four times a day (QID) | ORAL | Status: DC
  Administered 2018-02-06 – 2018-02-08 (×7): 600 mg via ORAL
  Filled 2018-02-06 (×7): qty 1

## 2018-02-06 MED ORDER — PENICILLIN G POT IN DEXTROSE 60000 UNIT/ML IV SOLN
3.0000 10*6.[IU] | INTRAVENOUS | Status: DC
  Filled 2018-02-06 (×5): qty 50

## 2018-02-06 MED ORDER — FENTANYL CITRATE (PF) 100 MCG/2ML IJ SOLN: 50.0000 ug | INTRAMUSCULAR | Status: DC | PRN

## 2018-02-06 MED ORDER — DIPHENHYDRAMINE HCL 50 MG/ML IJ SOLN: 12.5000 mg | INTRAMUSCULAR | Status: DC | PRN

## 2018-02-06 MED ORDER — OXYTOCIN 40 UNITS IN LACTATED RINGERS INFUSION - SIMPLE MED
2.5000 [IU]/h | INTRAVENOUS | Status: DC
  Filled 2018-02-06: qty 1000

## 2018-02-06 MED ORDER — PRENATAL MULTIVITAMIN CH
1.0000 | ORAL_TABLET | Freq: Every day | ORAL | Status: DC
  Administered 2018-02-06 – 2018-02-07 (×2): 1 via ORAL
  Filled 2018-02-06 (×2): qty 1

## 2018-02-06 MED ORDER — LACTATED RINGERS IV SOLN: 500.0000 mL | INTRAVENOUS | Status: DC | PRN

## 2018-02-06 MED ORDER — OXYTOCIN 40 UNITS IN LACTATED RINGERS INFUSION - SIMPLE MED: 1.0000 m[IU]/min | INTRAVENOUS | Status: DC

## 2018-02-06 MED ORDER — PHENYLEPHRINE 40 MCG/ML (10ML) SYRINGE FOR IV PUSH (FOR BLOOD PRESSURE SUPPORT)
PREFILLED_SYRINGE | INTRAVENOUS | Status: AC
  Filled 2018-02-06: qty 20

## 2018-02-06 MED ORDER — SOD CITRATE-CITRIC ACID 500-334 MG/5ML PO SOLN: 30.0000 mL | ORAL | Status: DC | PRN

## 2018-02-06 MED ORDER — BENZOCAINE-MENTHOL 20-0.5 % EX AERO
1.0000 "application " | INHALATION_SPRAY | CUTANEOUS | Status: DC | PRN
  Administered 2018-02-06: 1 via TOPICAL
  Filled 2018-02-06: qty 56

## 2018-02-06 MED ORDER — DIBUCAINE 1 % RE OINT: 1.0000 "application " | TOPICAL_OINTMENT | RECTAL | Status: DC | PRN

## 2018-02-06 MED ORDER — FENTANYL 2.5 MCG/ML BUPIVACAINE 1/10 % EPIDURAL INFUSION (WH - ANES): 14.0000 mL/h | INTRAMUSCULAR | Status: DC | PRN

## 2018-02-06 MED ORDER — LIDOCAINE HCL (PF) 1 % IJ SOLN
INTRAMUSCULAR | Status: DC | PRN
  Administered 2018-02-06 (×2): 5 mL via EPIDURAL

## 2018-02-06 MED ORDER — ONDANSETRON HCL 4 MG/2ML IJ SOLN: 4.0000 mg | INTRAMUSCULAR | Status: DC | PRN

## 2018-02-06 MED ORDER — FENTANYL 2.5 MCG/ML BUPIVACAINE 1/10 % EPIDURAL INFUSION (WH - ANES)
INTRAMUSCULAR | Status: DC | PRN
  Administered 2018-02-06: 14 mL/h via EPIDURAL

## 2018-02-06 MED ORDER — LACTATED RINGERS IV SOLN
500.0000 mL | Freq: Once | INTRAVENOUS | Status: AC
  Administered 2018-02-06: 500 mL via INTRAVENOUS

## 2018-02-06 MED ORDER — SENNOSIDES-DOCUSATE SODIUM 8.6-50 MG PO TABS
2.0000 | ORAL_TABLET | ORAL | Status: DC
  Administered 2018-02-07 – 2018-02-08 (×2): 2 via ORAL
  Filled 2018-02-06 (×2): qty 2

## 2018-02-06 MED ORDER — WITCH HAZEL-GLYCERIN EX PADS: 1.0000 "application " | MEDICATED_PAD | CUTANEOUS | Status: DC | PRN

## 2018-02-06 MED ORDER — ONDANSETRON HCL 4 MG PO TABS: 4.0000 mg | ORAL_TABLET | ORAL | Status: DC | PRN

## 2018-02-06 MED ORDER — FLEET ENEMA 7-19 GM/118ML RE ENEM: 1.0000 | ENEMA | RECTAL | Status: DC | PRN

## 2018-02-06 MED ORDER — ONDANSETRON HCL 4 MG/2ML IJ SOLN: 4.0000 mg | Freq: Four times a day (QID) | INTRAMUSCULAR | Status: DC | PRN

## 2018-02-06 MED ORDER — TETANUS-DIPHTH-ACELL PERTUSSIS 5-2.5-18.5 LF-MCG/0.5 IM SUSP: 0.5000 mL | Freq: Once | INTRAMUSCULAR | Status: DC

## 2018-02-06 MED ORDER — FENTANYL 2.5 MCG/ML BUPIVACAINE 1/10 % EPIDURAL INFUSION (WH - ANES)
INTRAMUSCULAR | Status: AC
  Filled 2018-02-06: qty 100

## 2018-02-06 MED ORDER — OXYCODONE-ACETAMINOPHEN 5-325 MG PO TABS: 2.0000 | ORAL_TABLET | ORAL | Status: DC | PRN

## 2018-02-06 MED ORDER — ACETAMINOPHEN 325 MG PO TABS: 650.0000 mg | ORAL_TABLET | ORAL | Status: DC | PRN

## 2018-02-06 MED ORDER — EPHEDRINE 5 MG/ML INJ
10.0000 mg | INTRAVENOUS | Status: DC | PRN
  Filled 2018-02-06: qty 2

## 2018-02-06 MED ORDER — SIMETHICONE 80 MG PO CHEW: 80.0000 mg | CHEWABLE_TABLET | ORAL | Status: DC | PRN

## 2018-02-06 MED ORDER — OXYCODONE-ACETAMINOPHEN 5-325 MG PO TABS: 1.0000 | ORAL_TABLET | ORAL | Status: DC | PRN

## 2018-02-06 MED ORDER — COCONUT OIL OIL: 1.0000 "application " | TOPICAL_OIL | Status: DC | PRN

## 2018-02-06 MED ORDER — DIPHENHYDRAMINE HCL 25 MG PO CAPS: 25.0000 mg | ORAL_CAPSULE | Freq: Four times a day (QID) | ORAL | Status: DC | PRN

## 2018-02-06 MED ORDER — OXYTOCIN BOLUS FROM INFUSION
500.0000 mL | Freq: Once | INTRAVENOUS | Status: AC
  Administered 2018-02-06: 500 mL via INTRAVENOUS

## 2018-02-06 MED ORDER — LIDOCAINE HCL (PF) 1 % IJ SOLN
30.0000 mL | INTRAMUSCULAR | Status: AC | PRN
  Administered 2018-02-06: 30 mL via SUBCUTANEOUS
  Filled 2018-02-06: qty 30

## 2018-02-06 MED ORDER — OXYTOCIN 40 UNITS IN LACTATED RINGERS INFUSION - SIMPLE MED
1.0000 m[IU]/min | INTRAVENOUS | Status: DC
  Administered 2018-02-06: 2 m[IU]/min via INTRAVENOUS

## 2018-02-06 MED ORDER — EPHEDRINE 5 MG/ML INJ
10.0000 mg | INTRAVENOUS | Status: DC | PRN
  Administered 2018-02-06: 10 mg via INTRAVENOUS
  Filled 2018-02-06: qty 4
  Filled 2018-02-06: qty 2

## 2018-02-06 MED ORDER — PHENYLEPHRINE 40 MCG/ML (10ML) SYRINGE FOR IV PUSH (FOR BLOOD PRESSURE SUPPORT)
80.0000 ug | PREFILLED_SYRINGE | INTRAVENOUS | Status: DC | PRN
  Filled 2018-02-06: qty 5

## 2018-02-06 MED ORDER — SODIUM CHLORIDE 0.9 % IV SOLN
5.0000 10*6.[IU] | Freq: Once | INTRAVENOUS | Status: AC
  Administered 2018-02-06: 5 10*6.[IU] via INTRAVENOUS
  Filled 2018-02-06: qty 5

## 2018-02-06 NOTE — Lactation Note (Signed)
This note was copied from a baby's chart. Lactation Consultation Note  Patient Name: Claudia Lucas PPIRJ'JToday's Date: 02/06/2018 Reason for consult: Initial assessment;Term;Infant < 6lbs  Used Arts administratornterpreter line for Arabic, Tahani # [phone]  10 hours old FT <6 pounds female who is being exclusively BF by her mother, she's a P2. Mom is experienced BF, she BF her older child for 4 months. Mom voiced she had issues latching her first baby on the right breast, just like with this new baby, baby latches on left breast without any problem, but she has a hard time latching baby on right breast.  Assisted mom with latch, LC had to sandwich the breast and after several attempts baby was able to latch and sustain the latch throughout the feeding. But mom and baby needed ongoing breast compressions in order to do so. Started mom with a DEBP, reviewed set up, cleaning and storage.   Mom will feed baby STS on both breast every 3 hours. She'll also pump after every feeding and feed that EBM back to baby. Taught mom how to hand express, and showed dad how to spoon feed baby EBM. Reviewed BF brochure, BF resources and feeding diary, mom is aware of LC services and will call PRN.  Maternal Data Formula Feeding for Exclusion: No Has patient been taught Hand Expression?: Yes Does the patient have breastfeeding experience prior to this delivery?: Yes  Feeding Feeding Type: Breast Fed Length of feed: 10 min  LATCH Score Latch: Repeated attempts needed to sustain latch, nipple held in mouth throughout feeding, stimulation needed to elicit sucking reflex.(on right breast "the problematic one")  Audible Swallowing: None  Type of Nipple: Everted at rest and after stimulation(short shafted)  Comfort (Breast/Nipple): Soft / non-tender  Hold (Positioning): Assistance needed to correctly position infant at breast and maintain latch.  LATCH Score: 6  Interventions Interventions: Breast feeding basics  reviewed;Assisted with latch;Skin to skin;Breast massage;Hand express;Breast compression;Adjust position;Support pillows;Position options;Expressed milk;DEBP  Lactation Tools Discussed/Used WIC Program: No Pump Review: Setup, frequency, and cleaning;Milk Storage Initiated by:: MPeck Date initiated:: 02/07/18   Consult Status Consult Status: Follow-up Date: 02/07/18 Follow-up type: In-patient    [name] [name] 02/06/2018, 8:57 PM

## 2018-02-06 NOTE — Anesthesia Postprocedure Evaluation (Signed)
Anesthesia Post Note  Patient: Claudia Lucas  Procedure(s) Performed: AN AD HOC LABOR EPIDURAL     Patient location during evaluation: Mother Baby Anesthesia Type: Epidural Level of consciousness: awake and alert and oriented Pain management: satisfactory to patient Vital Signs Assessment: post-procedure vital signs reviewed and stable Respiratory status: respiratory function stable Cardiovascular status: stable Postop Assessment: no headache, no backache, epidural receding, patient able to bend at knees, no signs of nausea or vomiting and adequate PO intake Anesthetic complications: no    Last Vitals:  Vitals:   02/06/18 1152 02/06/18 1300  BP: 113/70 122/73  Pulse: 77 68  Resp: 16 20  Temp: (!) 36.4 C 36.9 C    Last Pain:  Vitals:   02/06/18 1616  TempSrc:   PainSc: 2    Pain Goal:                 Angeles Zehner

## 2018-02-06 NOTE — Progress Notes (Signed)
Video interpreter Hind [id number] used for admission. Network failure. Then used phone interpreter [phone] for the remainder of the admission including cervical exam, IV, and plan of care update.

## 2018-02-06 NOTE — H&P (Addendum)
Jullianna Ok Edwardsbdo is a 22 y.o. female G2P1001 @ 2226w0d by sure LMP presenting for post date IOL. Previous c-section was for failure to descend in second stage (3 hours pushing).  Clinic [facility] Prenatal Labs  Dating  Certain LMP 04/25/17 Blood type: B/Positive/-- (09/11 0908) B+  Genetic Screen 1 Screen:    AFP:     Quad: 09/07/17     NIPS: Declined.  Antibody:Negative (09/11 0908)negative  Anatomic US  Normal Rubella: 18.10 (09/11 0908)immune  GTT Early:    A1c - 4.7       Third trimester: 83 137 106 RPR: Non Reactive (09/11 0908) negative  Flu vaccine  08/10/2017 HBsAg: Negative (09/11 0908) negative  TDaP vaccine   12/08/2017                                      Rhogam: N/A HIV:   negative  Baby Food breast                                           GBS: (For PCN allergy, check sensitivities) Pos  Contraception pills Pap: 2016- WNL   Circumcision  N/A   Pediatrician  [facility]  CF:  Support Person Husband Jaber  SMA  Prenatal Classes  yes- breastfeeding  Hgb electrophoresis: normal   OB History    Gravida Para Term Preterm AB Living   2 1 1     1    SAB TAB Ectopic Multiple Live Births         0 1     Past Medical History:  Diagnosis Date  . Anemia   . Ovarian cyst    Past Surgical History:  Procedure Laterality Date  . CESAREAN SECTION N/A 12/29/2015   Procedure: CESAREAN SECTION;  Surgeon: Lesly DukesKelly H Leggett, MD;  Location: [facility];  Service: Obstetrics;  Laterality: N/A;   Family History: family history includes Diabetes in her maternal aunt, maternal grandfather, maternal grandmother, maternal uncle, and mother; Hypertension in her maternal aunt, maternal grandfather, maternal grandmother, maternal uncle, and mother. Social History:  reports that  has never smoked. she has never used smokeless tobacco. She reports that she does not drink alcohol or use drugs.     Maternal Diabetes: No Genetic Screening: Declined Maternal Ultrasounds/Referrals: Normal Fetal Ultrasounds  or other Referrals:  None Maternal Substance Abuse:  No Significant Maternal Medications:  None Significant Maternal Lab Results:  Lab values include: Group B Strep positive Other Comments:  None  Review of Systems  Gastrointestinal: Positive for abdominal pain.       Uterine contractions  All other systems reviewed and are negative.  Maternal Medical History:  Reason for admission: Contractions.  IOL for postdates, reports regular contractions, + bloody show, + FM. Denies ROM  Contractions: Onset was yesterday.   Frequency: regular.   Perceived severity is moderate.    Fetal activity: Perceived fetal activity is normal.   Last perceived fetal movement was within the past hour.    Prenatal complications: no prenatal complications Prenatal Complications - Diabetes: none.   Hx of previous c/s for failure top descend  Dilation: 2.5 Effacement (%): 50 Station: -2 Exam by:: C Fisher RN Last menstrual period 04/25/2017, unknown if currently breastfeeding. Maternal Exam:  Uterine Assessment: Contraction strength is mild.  Contraction  frequency is regular.   Abdomen: Patient reports no abdominal tenderness. Surgical scars: low transverse.   Fundal height is S=D.   Estimated fetal weight is 7-7 1/2 lbs.   Fetal presentation: vertex  Introitus: Normal vulva. Normal vagina.  Ferning test: not done.  Nitrazine test: not done.  Cervix: not evaluated.   Physical Exam  Vitals reviewed. Constitutional: She is oriented to person, place, and time. She appears well-developed and well-nourished.  HENT:  Head: Normocephalic and atraumatic.  Eyes: EOM are normal. Pupils are equal, round, and reactive to light.  Neck: Normal range of motion. Neck supple.  Cardiovascular: Normal rate and regular rhythm.  Respiratory: Effort normal.  GI: Soft.  Genitourinary: Vagina normal and uterus normal.  Musculoskeletal: Normal range of motion.  Neurological: She is alert and oriented to  person, place, and time. She has normal reflexes.  Skin: Skin is warm and dry.  Psychiatric: She has a normal mood and affect. Her behavior is normal. Judgment and thought content normal.    Prenatal labs: ABO, Rh: B/Positive/-- (09/11 0908) Antibody: Negative (09/11 0908) Rubella: 18.10 (09/11 0908) RPR: Non Reactive (09/11 0908)  HBsAg: Negative (09/11 0908)  HIV: Non Reactive (09/11 0908)  GBS:   Positive Assessment/Plan:  Sundae Guion is a 22 y.o. female G2P1001 @ [redacted]w[redacted]d  By sure LMP presenting for post date IOL, TOLAC  Labor: Start pitocin Pain: Epidural FWB: Reassuring fetal heart tracing.  GBS positive Delivery plan: anticipate VBAC MOF: breast MOC: pop    Swiyyah O Harrington 02/06/2018, 6:56 AM   OB FELLOW HISTORY AND PHYSICAL ATTESTATION  I confirm that I have verified the information documented in the nurse midwife student's note and that I have also personally reperformed the physical exam and all medical decision making activities. I agree with above documentation and have made edits as needed.   [employer] OB Fellow 02/06/2018, 7:20 AM

## 2018-02-06 NOTE — Anesthesia Preprocedure Evaluation (Signed)
Anesthesia Evaluation  Patient identified by MRN, date of birth, ID band Patient awake    Reviewed: Allergy & Precautions, H&P , NPO status , Patient's Chart, lab work & pertinent test results  History of Anesthesia Complications Negative for: history of anesthetic complications  Airway Mallampati: II  TM Distance: >3 FB Neck ROM: full    Dental no notable dental hx. (+) Teeth Intact   Pulmonary neg pulmonary ROS,    Pulmonary exam normal breath sounds clear to auscultation       Cardiovascular negative cardio ROS Normal cardiovascular exam Rhythm:regular Rate:Normal     Neuro/Psych negative neurological ROS  negative psych ROS   GI/Hepatic negative GI ROS, Neg liver ROS,   Endo/Other  negative endocrine ROS  Renal/GU negative Renal ROS     Musculoskeletal negative musculoskeletal ROS (+)   Abdominal   Peds  Hematology  (+) anemia ,   Anesthesia Other Findings   Reproductive/Obstetrics (+) Pregnancy                             Anesthesia Physical Anesthesia Plan  ASA: II  Anesthesia Plan: Epidural   Post-op Pain Management:    Induction:   PONV Risk Score and Plan:   Airway Management Planned:   Additional Equipment:   Intra-op Plan:   Post-operative Plan:   Informed Consent: I have reviewed the patients History and Physical, chart, labs and discussed the procedure including the risks, benefits and alternatives for the proposed anesthesia with the patient or authorized representative who has indicated his/her understanding and acceptance.     Plan Discussed with:   Anesthesia Plan Comments:         Anesthesia Quick Evaluation

## 2018-02-06 NOTE — Anesthesia Procedure Notes (Signed)
Epidural Patient location during procedure: OB Start time: 02/06/2018 7:58 AM  Staffing Anesthesiologist: Leonides GrillsEllender, Sehar Sedano P, MD Performed: anesthesiologist   Preanesthetic Checklist Completed: patient identified, site marked, pre-op evaluation, timeout performed, IV checked, risks and benefits discussed and monitors and equipment checked  Epidural Patient position: sitting Prep: DuraPrep Patient monitoring: heart rate, cardiac monitor, continuous pulse ox and blood pressure Approach: midline Location: L4-L5 Injection technique: LOR air  Needle:  Needle type: Tuohy  Needle gauge: 17 G Needle length: 9 cm Needle insertion depth: 6 cm Catheter type: closed end flexible Catheter size: 19 Gauge Catheter at skin depth: 11 cm Test dose: negative and Other  Assessment Events: blood not aspirated, injection not painful, no injection resistance and negative IV test  Additional Notes Informed consent obtained prior to proceeding including risk of failure, 1% risk of PDPH, risk of minor discomfort and bruising. Discussed alternatives to epidural analgesia and patient desires to proceed.  Timeout performed pre-procedure verifying patient name, procedure, and platelet count.  Patient tolerated procedure well. Reason for block:procedure for pain

## 2018-02-06 NOTE — Progress Notes (Signed)
Arabic interpreter at bedside.  All previous communication was done through [employer] Interpreter tablet.

## 2018-02-07 NOTE — Discharge Summary (Signed)
OB Discharge Summary    Patient Name: Claudia Lucas DOB: Nov 20, 1996 MRN: [id number] Date of admission: 02/06/2018  Delivering MD: Pincus Large )  Date of discharge: 02/08/2018  Admitting diagnosis: PDIOL Intrauterine pregnancy: [redacted]w[redacted]d    Secondary diagnosis:  Active Problems:   Patient Active Problem List   Diagnosis Date Noted  . Post term pregnancy 02/06/2018  . VBAC (vaginal birth after Cesarean) 02/06/2018  . Language barrier 11/02/2017  . Cough 10/05/2017  . Supervision of other normal pregnancy, antepartum 08/10/2017  . Anemia of mother in pregnancy 08/10/2017  . Previous cesarean delivery, antepartum 12/31/2015   Additional problems:  H/o of prior C/s for failure to descend     Discharge diagnosis: Term Pregnancy Delivered and VBAC                                                                                               Post partum procedures:none   Complications: None  Hospital course:  Induction of Labor With Vaginal Delivery   22 y.o. yo G2P1001 at [redacted]w[redacted]d was admitted to the hospital 02/06/2018 for induction of labor.  Indication for induction: Postdates.  Patient had an uncomplicated labor course as follows: Membrane Rupture Time/Date: 10:05 AM ,02/06/2018   Intrapartum Procedures: Episiotomy: None [1]                                         Lacerations:  1st degree [2];Periurethral [8];Perineal [11]  Patient had delivery of a Viable infant.  Information for the patient's newborn:  Rea, [facility] [[id number]]  Delivery Method: VBAC, Spontaneous(Filed from Delivery Summary)   02/06/2018  Details of delivery can be found in separate delivery note.  Patient had a routine postpartum course. Patient is discharged home 02/08/18.  Physical exam  Vitals:   02/07/18 0624 02/07/18 1740  BP: 104/61 (!) 102/47  Pulse: 80 84  Resp: 18 18  Temp: 98.1 F (36.7 C) 98.6 F (37 C)    General: alert, cooperative and no distress Lochia: appropriate Uterine Fundus:  firm Incision: N/A DVT Evaluation: No evidence of DVT seen on physical exam.  Labs: No results found for this or any previous visit (from the past 24 hour(s)).   Discharge instruction: per After Visit Summary and "Baby and Me Booklet".  After visit meds:  No Known Allergies  Allergies as of 02/08/2018   No Known Allergies     Medication List    TAKE these medications   ibuprofen 600 MG tablet Commonly known as:  ADVIL,MOTRIN Take 1 tablet (600 mg total) by mouth every 6 (six) hours.   omeprazole 20 MG capsule Commonly known as:  PRILOSEC Take 1 capsule (20 mg total) by mouth daily.   PREPLUS 27-1 MG Tabs Take 1 tablet by mouth daily.      Diet: routine diet  Activity: Advance as tolerated. Pelvic rest for 6 weeks.   Outpatient follow up:4 weeks Future Appointments:  Future Appointments  Date Time Provider Department Center  03/21/2018  9:55 AM Marice Potter, Hollie Salk  Salena Saner, MD [facility]    Follow up Appt: No Follow-up on file.  Postpartum contraception: Progesterone only pills  Newborn Data: APGAR (1 MIN): 8   APGAR (5 MINS): 9    Baby Feeding: Breast Disposition:home with mother  Ellwood Denselison Jalacia Mattila, DO 02/08/2018

## 2018-02-07 NOTE — Plan of Care (Signed)
  Role Relationship: Ability to demonstrate positive interaction with newborn will improve 02/07/2018 1010 by Karn Cassissborne, Linnie Delgrande H, RN Note NT reported to RN that mother requested formula. When mother asked why she felt she needed formula, she stated that she felt that baby was not getting enough breast milk. Mother reports that feedings are going well. Parents have been informed of small tummy size of newborn, taught hand expression and understands the possible consequences of formula to the health of the infant. The possible consequences shared with patient include 1) Loss of confidence in breastfeeding 2) Engorgement 3) Allergic sensitization of baby(asthma/allergies) and 4) decreased milk supply for mother. Mother able to easily hand express breast milk. Mother also has DEBP. Discussed the fact that she will not see as much milk pumped with the DEBP now compared to what she can hand express and what baby will get while at the breast. After discussion of the above the mother decided to continue breast feeding and not give formula at this time. Encouraged mother to wake baby by unwrapping from blankets and doing skin to skin every three hours due to age to feed. Encouraged mother to feed more often if baby cues but at least every three hours. After assessing baby, baby woke up and was ready to breast feed. Mother latched baby. Mother has soft breast tissue and short shafted nipples so baby slipped off nipple a few times in beginning but was able to sustain latch eventually with good suck pattern and jaw movement. Swallows noted. Provided and explained shells and hand pump to mother in order to use prior to feedings to help pull nipples out more. Allowed mother to ask questions, in which she had none and felt comfortable with plan of care. Encouraged mother to call out if she needed assistance with feedings. Earl Galasborne, Linda HedgesStefanie HoustonHudspeth

## 2018-02-07 NOTE — Progress Notes (Signed)
Post Partum Day 1 Subjective: no complaints, up ad lib, voiding and tolerating PO  Objective: Blood pressure 104/61, pulse 80, temperature 98.1 F (36.7 C), temperature source Oral, resp. rate 18, height 5\' 3"  (1.6 m), weight 64.9 kg (143 lb), last menstrual period 04/25/2017, unknown if currently breastfeeding.  Physical Exam:  General: alert, cooperative and no distress Lochia: appropriate Uterine Fundus: firm DVT Evaluation: No evidence of DVT seen on physical exam. No cords or calf tenderness. No significant calf/ankle edema.  Recent Labs    02/06/18 0618  HGB 11.8*  HCT 33.1*    Assessment/Plan: Plan for discharge tomorrow and Breastfeeding   LOS: 1 day   Caryl AdaJazma Phelps, DO 02/07/2018, 7:53 AM

## 2018-02-07 NOTE — Plan of Care (Signed)
  Education: Knowledge of General Education information will improve 02/07/2018 1023 by Karn Cassissborne, Tinsley Everman H, RN Note Patient declined need for interpreter. After speaking with patient, patient responds appropriately during entire conversation. Encouraged patient to let staff know if at any time she needs interpreter assistance.

## 2018-02-08 MED ORDER — IBUPROFEN 600 MG PO TABS: 600.0000 mg | ORAL_TABLET | Freq: Four times a day (QID) | ORAL | 0 refills | Status: DC

## 2018-02-08 NOTE — Lactation Note (Signed)
This note was copied from a baby's chart. Lactation Consultation Note  Patient Name: Claudia Lucas Date: 02/08/2018 Reason for consult: Follow-up assessment;Infant < 6lbs;Term  Baby is 47 hours old ( Arabic interpreter - [place] [phone]  Weight loss 3%  LC reviewed and updated the doc flow sheets  Mom denies soreness, milk is coming in breast are filling.  Baby recently fed per mom for 10 mins. And this latch for 5 mins, Did not act hungry.  Sore nipple and engorgement prevention and tx reviewed.  Mom has a hand pump, DEBP kit if she needs to call [facility] for a DEBP.  Mother informed of post-discharge support and given phone number to the lactation department, including services for phone call assistance; out-patient appointments; and breastfeeding support group. List of other breastfeeding resources in the community given in the handout. Encouraged mother to call for problems or concerns related to breastfeeding.     Maternal Data Has patient been taught Hand Expression?: Yes  Feeding Feeding Type: Breast Fed Length of feed: 5 min  LATCH Score ( Latch Score by the RN )  Latch: Grasps breast easily, tongue down, lips flanged, rhythmical sucking.  Audible Swallowing: A few with stimulation  Type of Nipple: Everted at rest and after stimulation  Comfort (Breast/Nipple): Filling, red/small blisters or bruises, mild/mod discomfort  Hold (Positioning): Assistance needed to correctly position infant at breast and maintain latch.  LATCH Score: 7  Interventions Interventions: Breast feeding basics reviewed  Lactation Tools Discussed/Used     Consult Status Consult Status: Complete Date: 02/08/18    Myer Haff 02/08/2018, 10:41 AM

## 2018-02-08 NOTE — Plan of Care (Signed)
  Education: Knowledge of General Education information will improve 02/08/2018 1059 - Completed/Met by Tollie Eth, RN Note Discharge education and follow up reviewed with patient and significant other. Patient declined need for interpreter. Was in room while MD was reviewing information with interpreter. Patient and significant other verbalized understanding of information.

## 2018-02-08 NOTE — Discharge Instructions (Signed)
Vaginal Delivery, Care After °Refer to this sheet in the next few weeks. These instructions provide you with information about caring for yourself after vaginal delivery. Your health care provider may also give you more specific instructions. Your treatment has been planned according to current medical practices, but problems sometimes occur. Call your health care provider if you have any problems or questions. °What can I expect after the procedure? °After vaginal delivery, it is common to have: °· Some bleeding from your vagina. °· Soreness in your abdomen, your vagina, and the area of skin between your vaginal opening and your anus (perineum). °· Pelvic cramps. °· Fatigue. ° °Follow these instructions at home: °Medicines °· Take over-the-counter and prescription medicines only as told by your health care provider. °· If you were prescribed an antibiotic medicine, take it as told by your health care provider. Do not stop taking the antibiotic until it is finished. °Driving ° °· Do not drive or operate heavy machinery while taking prescription pain medicine. °· Do not drive for 24 hours if you received a sedative. °Lifestyle °· Do not drink alcohol. This is especially important if you are breastfeeding or taking medicine to relieve pain. °· Do not use tobacco products, including cigarettes, chewing tobacco, or e-cigarettes. If you need help quitting, ask your health care provider. °Eating and drinking °· Drink at least 8 eight-ounce glasses of water every day unless you are told not to by your health care provider. If you choose to breastfeed your baby, you may need to drink more water than this. °· Eat high-fiber foods every day. These foods may help prevent or relieve constipation. High-fiber foods include: °? Whole grain cereals and breads. °? Brown rice. °? Beans. °? Fresh fruits and vegetables. °Activity °· Return to your normal activities as told by your health care provider. Ask your health care provider  what activities are safe for you. °· Rest as much as possible. Try to rest or take a nap when your baby is sleeping. °· Do not lift anything that is heavier than your baby or 10 lb (4.5 kg) until your health care provider says that it is safe. °· Talk with your health care provider about when you can engage in sexual activity. This may depend on your: °? Risk of infection. °? Rate of healing. °? Comfort and desire to engage in sexual activity. °Vaginal Care °· If you have an episiotomy or a vaginal tear, check the area every day for signs of infection. Check for: °? More redness, swelling, or pain. °? More fluid or blood. °? Warmth. °? Pus or a bad smell. °· Do not use tampons or douches until your health care provider says this is safe. °· Watch for any blood clots that may pass from your vagina. These may look like clumps of dark red, brown, or black discharge. °General instructions °· Keep your perineum clean and dry as told by your health care provider. °· Wear loose, comfortable clothing. °· Wipe from front to back when you use the toilet. °· Ask your health care provider if you can shower or take a bath. If you had an episiotomy or a perineal tear during labor and delivery, your health care provider may tell you not to take baths for a certain length of time. °· Wear a bra that supports your breasts and fits you well. °· If possible, have someone help you with household activities and help care for your baby for at least a few days after   you leave the hospital. °· Keep all follow-up visits for you and your baby as told by your health care provider. This is important. °Contact a health care provider if: °· You have: °? Vaginal discharge that has a bad smell. °? Difficulty urinating. °? Pain when urinating. °? A sudden increase or decrease in the frequency of your bowel movements. °? More redness, swelling, or pain around your episiotomy or vaginal tear. °? More fluid or blood coming from your episiotomy or  vaginal tear. °? Pus or a bad smell coming from your episiotomy or vaginal tear. °? A fever. °? A rash. °? Little or no interest in activities you used to enjoy. °? Questions about caring for yourself or your baby. °· Your episiotomy or vaginal tear feels warm to the touch. °· Your episiotomy or vaginal tear is separating or does not appear to be healing. °· Your breasts are painful, hard, or turn red. °· You feel unusually sad or worried. °· You feel nauseous or you vomit. °· You pass large blood clots from your vagina. If you pass a blood clot from your vagina, save it to show to your health care provider. Do not flush blood clots down the toilet without having your health care provider look at them. °· You urinate more than usual. °· You are dizzy or light-headed. °· You have not breastfed at all and you have not had a menstrual period for 12 weeks after delivery. °· You have stopped breastfeeding and you have not had a menstrual period for 12 weeks after you stopped breastfeeding. °Get help right away if: °· You have: °? Pain that does not go away or does not get better with medicine. °? Chest pain. °? Difficulty breathing. °? Blurred vision or spots in your vision. °? Thoughts about hurting yourself or your baby. °· You develop pain in your abdomen or in one of your legs. °· You develop a severe headache. °· You faint. °· You bleed from your vagina so much that you fill two sanitary pads in one hour. °This information is not intended to replace advice given to you by your health care provider. Make sure you discuss any questions you have with your health care provider. °Document Released: 11/13/2000 Document Revised: 04/29/2016 Document Reviewed: 12/01/2015 °Elsevier Interactive Patient Education © 2018 [employer] ° °

## 2018-03-21 ENCOUNTER — Encounter: Payer: Self-pay | Admitting: Obstetrics & Gynecology

## 2018-03-21 ENCOUNTER — Ambulatory Visit (INDEPENDENT_AMBULATORY_CARE_PROVIDER_SITE_OTHER): Payer: Medicaid Other | Admitting: Obstetrics & Gynecology

## 2018-03-21 ENCOUNTER — Other Ambulatory Visit (HOSPITAL_COMMUNITY)
Admission: RE | Admit: 2018-03-21 | Discharge: 2018-03-21 | Disposition: A | Discharge status: Home / Self Care | Payer: Medicaid Other | Source: Ambulatory Visit | Attending: Obstetrics & Gynecology | Admitting: Obstetrics & Gynecology

## 2018-03-21 DIAGNOSIS — Z0000 Encounter for general adult medical examination without abnormal findings: Secondary | ICD-10-CM

## 2018-03-21 DIAGNOSIS — J329 Chronic sinusitis, unspecified: Secondary | ICD-10-CM

## 2018-03-21 LAB — POCT PREGNANCY, URINE: Preg Test, Ur: NEGATIVE

## 2018-03-21 MED ORDER — NORETHINDRONE 0.35 MG PO TABS: 1.0000 | ORAL_TABLET | Freq: Every day | ORAL | 11 refills | Status: DC

## 2018-03-21 NOTE — Progress Notes (Signed)
Subjective:     Claudia Lucas is a 22 y.o. Arabic-speaking P female who presents for a postpartum visit. She is 6 weeks postpartum following a spontaneous vaginal delivery. I have fully reviewed the prenatal and intrapartum course. The delivery was at 41 gestational weeks. Outcome: vaginal birth after cesarean (VBAC). Anesthesia: epidural. Postpartum course has been unremarkable. Baby's course has been unremarkable. Baby is feeding by breast. Bleeding no bleeding. Bowel function is abnormal: constipation. Bladder function is normal. Patient is not sexually active. Contraception method is none. Postpartum depression screening: negative. Arabic interpreter Claudia Lucas [phone]#[id number] used.  The following portions of the patient's history were reviewed and updated as appropriate: allergies, current medications, past family history, past medical history, past social history, past surgical history and problem list.  Review of Systems Pertinent items are noted in HPI.   Objective:    LMP 04/25/2017 (Exact Date)   General:  alert   Breasts:  inspection negative, no nipple discharge or bleeding, no masses or nodularity palpable  Lungs: clear to auscultation bilaterally  Heart:  regular rate and rhythm, S1, S2 normal, no murmur, click, rub or gallop  Abdomen: soft, non-tender; bowel sounds normal; no masses,  no organomegaly   Vulva:  normal  Vagina: normal vagina  Cervix:  anteverted  Corpus: not examined  Adnexa:  not evaluated  Rectal Exam: Not performed.        Assessment:     Normal postpartum exam. Pap smear done at today's visit.   Plan:    1. Contraception: oral progesterone-only contraceptive. Failure rate discussed. Rec back up for a month. 2. Pap smear today 3. Follow up in: 1 year or as needed.

## 2018-03-21 NOTE — Addendum Note (Signed)
Addended by: Garret ReddishBARNES, Eren Puebla M on: 03/21/2018 11:24 AM   Modules accepted: Orders

## 2018-03-22 LAB — CYTOLOGY - PAP: Diagnosis: NEGATIVE

## 2018-05-11 ENCOUNTER — Ambulatory Visit (INDEPENDENT_AMBULATORY_CARE_PROVIDER_SITE_OTHER): Payer: Medicaid Other | Admitting: Obstetrics & Gynecology

## 2018-05-11 ENCOUNTER — Encounter: Payer: Self-pay | Admitting: Obstetrics & Gynecology

## 2018-05-11 VITALS — BP 113/77 | HR 82 | Resp 16 | Wt 126.6 lb

## 2018-05-11 DIAGNOSIS — Z01812 Encounter for preprocedural laboratory examination: Secondary | ICD-10-CM

## 2018-05-11 DIAGNOSIS — Z30017 Encounter for initial prescription of implantable subdermal contraceptive: Secondary | ICD-10-CM

## 2018-05-11 LAB — POCT PREGNANCY, URINE: Preg Test, Ur: NEGATIVE

## 2018-05-11 MED ORDER — ETONOGESTREL 68 MG ~~LOC~~ IMPL
68.0000 mg | DRUG_IMPLANT | Freq: Once | SUBCUTANEOUS | Status: AC
  Administered 2018-05-11: 68 mg via SUBCUTANEOUS

## 2018-05-11 NOTE — Progress Notes (Signed)
   Subjective:    Patient ID: Claudia Lucas, female    DOB: 04/08/1996, 22 y.o.   MRN: [id number]  HPI 22 yo P2 here for Nexplanon. She is currently taking OCPs. She understands that irregular bleeding and sometimes absence of periods is a possible side effect.   Review of Systems Pap is normal and up to date    Objective:   Physical Exam Breathing, conversing, and ambulating normally Well nourished, well hydrated Black female, no apparent distress Live Arabic interpretor used for visit UPT was negative. Consent was signed. Time out procedure was done. Her left arm was prepped with betadine and infiltrated with 3 cc of 1% lidocaine. After adequate anesthesia was assured, the Nexplanon device was placed according to standard of care. Her arm was hemostatic and was bandaged. She tolerated the procedure well.    Assessment & Plan:  Contraception- I rec'd that she continue OCPs for 2 more weeks

## 2018-05-11 NOTE — Addendum Note (Signed)
Addended by: Areta HaberMOREHEAD, Catlyn Shipton B on: 05/11/2018 04:37 PM   Modules accepted: Orders

## 2018-08-15 ENCOUNTER — Other Ambulatory Visit: Payer: Self-pay

## 2018-08-15 ENCOUNTER — Emergency Department (HOSPITAL_COMMUNITY)
Admission: EM | Admit: 2018-08-15 | Discharge: 2018-08-15 | Disposition: A | Discharge status: Home / Self Care | Payer: Medicaid Other | Attending: Emergency Medicine | Admitting: Emergency Medicine

## 2018-08-15 DIAGNOSIS — R51 Headache: Secondary | ICD-10-CM | POA: Diagnosis not present

## 2018-08-15 DIAGNOSIS — Y9389 Activity, other specified: Secondary | ICD-10-CM | POA: Diagnosis not present

## 2018-08-15 DIAGNOSIS — X58XXXA Exposure to other specified factors, initial encounter: Secondary | ICD-10-CM | POA: Diagnosis not present

## 2018-08-15 DIAGNOSIS — Y998 Other external cause status: Secondary | ICD-10-CM | POA: Insufficient documentation

## 2018-08-15 DIAGNOSIS — H9201 Otalgia, right ear: Secondary | ICD-10-CM | POA: Insufficient documentation

## 2018-08-15 DIAGNOSIS — Y929 Unspecified place or not applicable: Secondary | ICD-10-CM | POA: Insufficient documentation

## 2018-08-15 DIAGNOSIS — T161XXA Foreign body in right ear, initial encounter: Secondary | ICD-10-CM

## 2018-08-15 MED ORDER — NEOMYCIN-POLYMYXIN-HC 3.5-10000-1 OT SUSP: 4.0000 [drp] | Freq: Three times a day (TID) | OTIC | 0 refills | Status: DC

## 2018-08-15 NOTE — ED Notes (Signed)
Tried irrigation but no success

## 2018-08-15 NOTE — ED Triage Notes (Signed)
Patient c/o right ear pain and HA x3 days. Patient speaks arabic.

## 2018-08-15 NOTE — ED Provider Notes (Signed)
MSE was initiated and I personally evaluated the patient and placed orders (if any) at  10:02 PM on August 15, 2018.  The patient appears stable so that the remainder of the MSE may be completed by another provider.  Patient placed in Quick Look pathway, seen and evaluated   Chief Complaint: Right otalgia and headache  HPI:   History obtained with the assistance of Arabic interpreter.  Patient reports that for the past 2 days, she has had right otalgia.  Patient reports headache that began 3 days ago that was in bitemporal region, progressed to right-sided headache behind her ear.  Patient reports that she has had "watery drainage" from her right ear and it feels "itchy".  No fever or chills.  ROS: See HPI (one)  Physical Exam:   Gen: No distress  Neuro: Awake and Alert  Skin: Warm    Focused Exam: Right ear with no edema of the external auditory canal, however there is a white hairlike residue. Patient does report TTP of tragus and pinna. No tenderness palpation of mastoid. EOMI. PERRL. Moves all extremities equally and with good coordination.   Initiation of care has begun. The patient has been counseled on the process, plan, and necessity for staying for the completion/evaluation, and the remainder of the medical screening examination    Delia ChimesMurray, Autrey Human B, PA-C 08/15/18 2206    Raeford RazorKohut, Stephen, MD 08/16/18 1524

## 2018-08-15 NOTE — ED Provider Notes (Signed)
[facility] Provider Note   CSN: [id number] Arrival date & time: 08/15/18  2020     History   Chief Complaint Chief Complaint  Patient presents with  . Otalgia  . Headache    HPI Claudia Lucas is a 22 y.o. female.  22yo female with pain in the right ear and headache (top of head). Headaches started 5 days ago, intermittent; right ear pain x 3 days better today, with drainage from the ear- watery, ear is tender to the touch, no recent swimming. Has not taken anything for her symptoms. Symptoms are recurrent, states these symptoms occur with "allergies in my ears." Diagnosed by PCP who gave medicine to take for allergies in the nose and ears. Stopped taking the medicine 3 days ago, lost the medicine 3 days ago but found the medicine today. Unknown pills. Patient is breastfeeding.  Denies cough, congestion, sinus pressure or pain.  A language interpreter was used.    Past Medical History:  Diagnosis Date  . Anemia   . Ovarian cyst     There are no active problems to display for this patient.   Past Surgical History:  Procedure Laterality Date  . CESAREAN SECTION N/A 12/29/2015   Procedure: CESAREAN SECTION;  Surgeon: Lesly DukesKelly H Leggett, MD;  Location: [facility];  Service: Obstetrics;  Laterality: N/A;     OB History    Gravida  2   Para  1   Term  1   Preterm      AB      Living  1     SAB      TAB      Ectopic      Multiple  0   Live Births  1            Home Medications    Prior to Admission medications   Medication Sig Start Date End Date Taking? Authorizing Provider  neomycin-polymyxin-hydrocortisone (CORTISPORIN) 3.5-10000-1 OTIC suspension Place 4 drops into the right ear 3 (three) times daily. 08/15/18   Jeannie FendMurphy, Laura A, PA-C  norethindrone (MICRONOR,CAMILA,ERRIN) 0.35 MG tablet Take 1 tablet (0.35 mg total) by mouth daily. 03/21/18   Allie Bossierove, Myra C, MD    Family History Family History  Problem Relation  Age of Onset  . Diabetes Mother   . Hypertension Mother   . Hypertension Maternal Grandmother   . Diabetes Maternal Grandmother   . Hypertension Maternal Grandfather   . Diabetes Maternal Grandfather   . Hypertension Maternal Aunt   . Diabetes Maternal Aunt   . Hypertension Maternal Uncle   . Diabetes Maternal Uncle   . Cancer Neg Hx   . Heart disease Neg Hx   . Stroke Neg Hx     Social History Social History   Tobacco Use  . Smoking status: Never Smoker  . Smokeless tobacco: Never Used  Substance Use Topics  . Alcohol use: No  . Drug use: No     Allergies   Patient has no known allergies.   Review of Systems Review of Systems  Constitutional: Negative for chills and fever.  HENT: Positive for ear discharge and ear pain. Negative for congestion, sinus pressure, sinus pain, sneezing and sore throat.   Eyes: Negative for visual disturbance.  Gastrointestinal: Negative for nausea and vomiting.  Skin: Negative for rash and wound.  Allergic/Immunologic: Negative for immunocompromised state.  Neurological: Positive for headaches. Negative for dizziness and weakness.  All other systems reviewed and are negative.  Physical Exam Updated Vital Signs BP 105/69 (BP Location: Right Arm)   Pulse 89   Temp 98.2 F (36.8 C)   Resp 16   Wt 60 kg   SpO2 100%   BMI 23.43 kg/m   Physical Exam  Constitutional: She is oriented to person, place, and time. She appears well-developed and well-nourished. No distress.  HENT:  Head: Normocephalic and atraumatic.  Right Ear: Tympanic membrane and external ear normal. A foreign body is present.  Left Ear: Tympanic membrane, external ear and ear canal normal.  Ears:  Eyes: Pupils are equal, round, and reactive to light. EOM are normal.  Neck: Neck supple.  Cardiovascular: Normal rate and regular rhythm.  Pulmonary/Chest: Effort normal and breath sounds normal.  Neurological: She is alert and oriented to person, place, and  time. GCS eye subscore is 4. GCS verbal subscore is 5. GCS motor subscore is 6.  Skin: She is not diaphoretic.  Psychiatric: She has a normal mood and affect. Her behavior is normal.  Nursing note and vitals reviewed.    ED Treatments / Results  Labs (all labs ordered are listed, but only abnormal results are displayed) Labs Reviewed - No data to display  EKG None  Radiology No results found.  Procedures .Foreign Body Removal Date/Time: 08/15/2018 11:46 PM Performed by: Jeannie Fend, PA-C Authorized by: Jeannie Fend, PA-C  Consent: Verbal consent obtained. Consent given by: patient Patient understanding: patient states understanding of the procedure being performed Patient identity confirmed: verbally with patient Body area: ear Location details: right ear  Sedation: Patient sedated: no  Patient restrained: no Localization method: ENT speculum Removal mechanism: forceps Complexity: simple 1 objects recovered. Objects recovered: cotton Post-procedure assessment: foreign body removed Patient tolerance: Patient tolerated the procedure well with no immediate complications   (including critical care time)  Medications Ordered in ED Medications - No data to display   Initial Impression / Assessment and Plan / ED Course  I have reviewed the triage vital signs and the nursing notes.  Pertinent labs & imaging results that were available during my care of the patient were reviewed by me and considered in my medical decision making (see chart for details).  Clinical Course as of Aug 16 2347  Mon Aug 15, 2018  6321 22 year old female presents with complaint of right ear pain, tenderness, drainage from the ear and a headache.  On exam patient has a small cotton foreign body in the ear, she states that she does remember itching her ear several days ago with cotton.  Exam is otherwise unremarkable.  RN attempted to irrigate cottonball from the ear unsuccessfully.  Foreign  body was removed closer to the external canal and was easily removed with alligator forceps.  TM is intact.  Patient was given prescription for Cortisporin otic drops, she is breast-feeding.  Advised to recheck with PCP.   [LM]    Clinical Course User Index [LM] Jeannie Fend, PA-C    Final Clinical Impressions(s) / ED Diagnoses   Final diagnoses:  Right ear pain  Foreign body of right ear, initial encounter    ED Discharge Orders         Ordered    neomycin-polymyxin-hydrocortisone (CORTISPORIN) 3.5-10000-1 OTIC suspension  3 times daily     08/15/18 2327           Jeannie Fend, PA-C 08/15/18 2348    Margarita Grizzle, MD 08/16/18 [phone]

## 2018-08-15 NOTE — Discharge Instructions (Signed)
Apply drops to ear as prescribed. Recheck with your doctor.

## 2018-08-23 ENCOUNTER — Encounter (HOSPITAL_COMMUNITY): Payer: Self-pay | Admitting: Emergency Medicine

## 2018-08-23 ENCOUNTER — Emergency Department (HOSPITAL_COMMUNITY)
Admission: EM | Admit: 2018-08-23 | Discharge: 2018-08-23 | Disposition: A | Discharge status: Home / Self Care | Payer: Medicaid Other | Attending: Emergency Medicine | Admitting: Emergency Medicine

## 2018-08-23 DIAGNOSIS — Z79899 Other long term (current) drug therapy: Secondary | ICD-10-CM | POA: Insufficient documentation

## 2018-08-23 DIAGNOSIS — H60501 Unspecified acute noninfective otitis externa, right ear: Secondary | ICD-10-CM | POA: Diagnosis not present

## 2018-08-23 DIAGNOSIS — R51 Headache: Secondary | ICD-10-CM | POA: Insufficient documentation

## 2018-08-23 DIAGNOSIS — R519 Headache, unspecified: Secondary | ICD-10-CM

## 2018-08-23 LAB — POC URINE PREG, ED: Preg Test, Ur: NEGATIVE

## 2018-08-23 MED ORDER — ACETAMINOPHEN 325 MG PO TABS
650.0000 mg | ORAL_TABLET | Freq: Once | ORAL | Status: AC
Start: 2018-08-23 — End: 2018-08-23
  Administered 2018-08-23: 650 mg via ORAL
  Filled 2018-08-23: qty 2

## 2018-08-23 MED ORDER — NEOMYCIN-POLYMYXIN-HC 3.5-10000-1 OT SUSP: 4.0000 [drp] | Freq: Four times a day (QID) | OTIC | 0 refills | Status: AC

## 2018-08-23 MED ORDER — METOCLOPRAMIDE HCL 10 MG PO TABS
5.0000 mg | ORAL_TABLET | Freq: Once | ORAL | Status: AC
  Administered 2018-08-23: 5 mg via ORAL
  Filled 2018-08-23: qty 1

## 2018-08-23 MED ORDER — KETOROLAC TROMETHAMINE 15 MG/ML IJ SOLN
15.0000 mg | Freq: Once | INTRAMUSCULAR | Status: AC
  Administered 2018-08-23: 15 mg via INTRAMUSCULAR
  Filled 2018-08-23: qty 1

## 2018-08-23 NOTE — ED Triage Notes (Signed)
Pt states since yesterday she has had a constant headache. Pt denies any nausea with headache, no dizziness or vision change. Pt has history to headaches.

## 2018-08-23 NOTE — ED Provider Notes (Signed)
[facility] EMERGENCY DEPARTMENT Provider Note   CSN: [id number] Arrival date & time: 08/23/18  1403     History   Chief Complaint Chief Complaint  Patient presents with  . Headache    HPI Claudia Lucas is a 22 y.o. female.  HPI   Patient is a 22 year old female with history of anemia and ovarian cyst who presents emergency department today for evaluation of headache that has been intermittent for the last week.  Patient states that she was seen in the ED on 08/16/2018 and had a foreign body removed from her ear.  Since then she has been taking the over-the-counter drops for her ear.  Since then headache has been intermittent.  Not worst headache of life.  Rates pain 5/10 currently.  Pain located to the top of her head.  No recent falls or trauma.  No vision changes, lightheadedness, dizziness, numbness or weakness to the arms or legs.  No eye pain, chest pain, abdominal pain, nausea or vomiting.  No neck stiffness or neck pain.  No fevers or chills. Took tylenol over the past several days which improved her sxs.  Past Medical History:  Diagnosis Date  . Anemia   . Ovarian cyst     There are no active problems to display for this patient.   Past Surgical History:  Procedure Laterality Date  . CESAREAN SECTION N/A 12/29/2015   Procedure: CESAREAN SECTION;  Surgeon: Lesly Dukes, MD;  Location: [facility];  Service: Obstetrics;  Laterality: N/A;     OB History    Gravida  2   Para  1   Term  1   Preterm      AB      Living  1     SAB      TAB      Ectopic      Multiple  0   Live Births  1            Home Medications    Prior to Admission medications   Medication Sig Start Date End Date Taking? Authorizing Provider  neomycin-polymyxin-hydrocortisone (CORTISPORIN) 3.5-10000-1 OTIC suspension Place 4 drops into the right ear 4 (four) times daily for 7 days. 08/23/18 08/30/18  Glorious Flicker S, PA-C  norethindrone  (MICRONOR,CAMILA,ERRIN) 0.35 MG tablet Take 1 tablet (0.35 mg total) by mouth daily. 03/21/18   Allie Bossier, MD    Family History Family History  Problem Relation Age of Onset  . Diabetes Mother   . Hypertension Mother   . Hypertension Maternal Grandmother   . Diabetes Maternal Grandmother   . Hypertension Maternal Grandfather   . Diabetes Maternal Grandfather   . Hypertension Maternal Aunt   . Diabetes Maternal Aunt   . Hypertension Maternal Uncle   . Diabetes Maternal Uncle   . Cancer Neg Hx   . Heart disease Neg Hx   . Stroke Neg Hx     Social History Social History   Tobacco Use  . Smoking status: Never Smoker  . Smokeless tobacco: Never Used  Substance Use Topics  . Alcohol use: No  . Drug use: No     Allergies   Patient has no known allergies.   Review of Systems Review of Systems  Constitutional: Negative for chills and fever.  HENT: Negative for congestion, ear pain, rhinorrhea and sore throat.   Eyes: Negative for pain and visual disturbance.  Respiratory: Negative for cough and shortness of breath.   Cardiovascular: Negative for  chest pain and palpitations.  Gastrointestinal: Negative for abdominal pain, constipation, diarrhea, nausea and vomiting.  Genitourinary: Negative for dysuria, flank pain and hematuria.  Musculoskeletal: Negative for back pain and neck pain.  Skin: Negative for color change and rash.  Neurological: Positive for headaches. Negative for dizziness, weakness, light-headedness and numbness.  All other systems reviewed and are negative.    Physical Exam Updated Vital Signs BP 101/69 (BP Location: Right Arm)   Pulse 68   Temp 97.6 F (36.4 C) (Oral)   Resp 18   LMP  (LMP Unknown)   SpO2 99%   Physical Exam  Constitutional: She appears well-developed and well-nourished.  Non-toxic appearance. She does not appear ill. No distress.  HENT:  Head: Normocephalic and atraumatic.  Mouth/Throat: Oropharynx is clear and moist.    Left TM without erythema or effusion.  Right TM appears possibly opacified, the canal appears to have persistent otitis externa.  Eyes: Pupils are equal, round, and reactive to light. Conjunctivae and EOM are normal. Right eye exhibits no nystagmus. Left eye exhibits no nystagmus.  Neck: Normal range of motion. Neck supple. No neck rigidity.  Cardiovascular: Normal rate, regular rhythm, normal heart sounds and intact distal pulses.  No murmur heard. Pulmonary/Chest: Effort normal and breath sounds normal. No stridor. No respiratory distress. She has no wheezes.  Abdominal: Soft. Bowel sounds are normal. There is no tenderness.  Musculoskeletal: She exhibits no edema.  Neurological: She is alert.  Mental Status:  Alert, thought content appropriate, able to give a coherent history. Speech fluent without evidence of aphasia. Able to follow 2 step commands without difficulty.  Cranial Nerves:  II:   pupils equal, round, reactive to light III,IV, VI: ptosis not present, extra-ocular motions intact bilaterally  V,VII: smile symmetric, facial light touch sensation equal VIII: hearing grossly normal to voice  X: uvula elevates symmetrically  XI: bilateral shoulder shrug symmetric and strong XII: midline tongue extension without fassiculations Motor:  Normal tone. 5/5 strength of BUE and BLE major muscle groups including strong and equal grip strength and dorsiflexion/plantar flexion Sensory: light touch normal in all extremities.  Skin: Skin is warm and dry.  Psychiatric: She has a normal mood and affect.  Nursing note and vitals reviewed.  ED Treatments / Results  Labs (all labs ordered are listed, but only abnormal results are displayed) Labs Reviewed  POC URINE PREG, ED    EKG None  Radiology No results found.  Procedures Procedures (including critical care time)  Medications Ordered in ED Medications  metoCLOPramide (REGLAN) tablet 5 mg (5 mg Oral Given 08/23/18 2006)   ketorolac (TORADOL) 15 MG/ML injection 15 mg (15 mg Intramuscular Given 08/23/18 2007)  acetaminophen (TYLENOL) tablet 650 mg (650 mg Oral Given 08/23/18 2006)     Initial Impression / Assessment and Plan / ED Course  I have reviewed the triage vital signs and the nursing notes.  Pertinent labs & imaging results that were available during my care of the patient were reviewed by me and considered in my medical decision making (see chart for details).  CONSULT with Colton from pharmacy who reviewed the ordered medications and agrees that they should be safe for a one time dose to tx the patients headache given that she is currently breastfeeding.  Final Clinical Impressions(s) / ED Diagnoses   Final diagnoses:  Nonintractable headache, unspecified chronicity pattern, unspecified headache type  Acute otitis externa of right ear, unspecified type   Pt HA treated and improved while in ED.  Presentation is non concerning for [facility], ICH, Meningitis, or temporal arteritis. Pt is afebrile with no focal neuro deficits, nuchal rigidity, or change in vision. Pt is to follow up with PCP to discuss prophylactic medication. She also has evidence of persistent otitis externa on exam. Will give rx for cortisporin suspension. Pt verbalizes understanding and is agreeable with plan to dc.    ED Discharge Orders         Ordered    neomycin-polymyxin-hydrocortisone (CORTISPORIN) 3.5-10000-1 OTIC suspension  4 times daily     08/23/18 2144           Karrie MeresCouture, Keller Mikels S, PA-C 08/23/18 2145    Melene PlanFloyd, Dan, DO 08/23/18 2213

## 2018-08-23 NOTE — Discharge Instructions (Addendum)
Please use the ear drops as directed.  You may take tylenol for your headaches .  Please follow up with your primary care provider within 5-7 days for re-evaluation of your symptoms. If you do not have a primary care provider, information for a healthcare clinic has been provided for you to make arrangements for follow up care. Please return to the emergency department for any new or worsening symptoms.

## 2018-10-25 NOTE — Congregational Nurse Program (Signed)
Office visit for this Arabic speaking woman with an infant child and attending English classes at [employer]. Requesting assistance in a self referral to ENT for continuous ear congestion and nasal drainage. Slight hearing loss in right ear.  Describes headache and pain as in posterior area of cranium and extending around to right ear and neck.  Nasal mucosa irritated around nasal passage and obvious mucous in right nostril especially. Low blood pressure. Possibly slightly dehydrated. Admits to not consuming enough water and breast feeding. Suggestions on staying hydrated, esp breastfeeding infant. Scheduled appointment at [facility], [address], [place] on 10/31/18 at 3:00pm as a returning patient. Return prn. Ferol LuzMarietta Thos Matsumoto, RN/CN.

## 2018-12-19 ENCOUNTER — Encounter: Payer: Self-pay | Admitting: Allergy

## 2018-12-19 ENCOUNTER — Ambulatory Visit (INDEPENDENT_AMBULATORY_CARE_PROVIDER_SITE_OTHER): Payer: Medicaid Other | Admitting: Allergy

## 2018-12-19 VITALS — BP 110/60 | HR 76 | Resp 16 | Ht 60.0 in | Wt 123.2 lb

## 2018-12-19 DIAGNOSIS — J3089 Other allergic rhinitis: Secondary | ICD-10-CM

## 2018-12-19 MED ORDER — MONTELUKAST SODIUM 10 MG PO TABS: 10.0000 mg | ORAL_TABLET | Freq: Every day | ORAL | 5 refills | Status: DC

## 2018-12-19 MED ORDER — FLUTICASONE PROPIONATE 50 MCG/ACT NA SUSP: 2.0000 | Freq: Every day | NASAL | 3 refills | Status: DC

## 2018-12-19 NOTE — Progress Notes (Signed)
New Patient Note  RE: Claudia [facility] MRN: [id number] DOB: 03-Jan-1996 Date of Office Visit: 12/19/2018  Referring provider: Aquilla Hacker, PA-C Primary care provider: Aquilla Hacker, PA-C  Chief Complaint: Allergies and Nasal Congestion  History of Present Illness: I had the pleasure of seeing Claudia [facility] for initial evaluation at the [facility] on 12/19/2018. She is a 23 y.o. female, who is referred here by Claudia Hacker, PA-C for the evaluation of allergies and nasal congestion. She is accompanied today by an interpretor who helped translate Arabic.  She reports symptoms of nasal congestion, rhinorrhea, sneezing, itchy eyes/nose/ears. Symptoms have been going on for 20+ years but worse the past 2 years. The symptoms are present all year around with worsening in winter. Anosmia: no. Headache: yes. She has used Claritin, Flonase inconsistently with some improvement in symptoms. Sinus infections: no. Previous work up includes: no. Previous ENT evaluation: yes. Also complains of some scalp pruritus at times.   Assessment and Plan: Claudia Lucas is a 23 y.o. female with: Other allergic rhinitis Perennial rhinitis symptoms for the past 20+ years but worse the past 2 years. Tried Claritin and Flonase inconsistently with some benefit.  Today's testing showed: Positive to grass, ragweed, weed, trees, mold dust mites, cat, cockroach. Discussed environmental control measures.   Start using Flonase 2 sprays daily every day. Demonstrated proper use.  Nasal saline spray (i.e., Simply Saline) or nasal saline lavage (i.e., NeilMed) is recommended as needed and prior to medicated nasal sprays.  Start Singulair 10mg  daily.  May use over the counter antihistamines such as Zyrtec (cetirizine), Claritin (loratadine), Allegra (fexofenadine), or Xyzal (levocetirizine) daily as needed.  If above regimen does not control symptoms then will discuss allergy immunotherapy in more  detail at next visit.  Return in about 2 months (around 02/17/2019).  Meds ordered this encounter  Medications  . fluticasone (FLONASE) 50 MCG/ACT nasal spray    Sig: Place 2 sprays into both nostrils daily.    Dispense:  16 g    Refill:  3  . montelukast (SINGULAIR) 10 MG tablet    Sig: Take 1 tablet (10 mg total) by mouth at bedtime.    Dispense:  30 tablet    Refill:  5   Other allergy screening: Asthma: no Rhino conjunctivitis: yes Food allergy: no Medication allergy: no Hymenoptera allergy: no Urticaria: no Eczema:no History of recurrent infections suggestive of immunodeficency: no  Diagnostics: Skin Testing: Environmental allergy panel. Positive test to: grass, ragweed, weed, trees, mold, dust mites, cat, cockroach.  Results discussed with patient/family. Airborne Adult Perc - 12/19/18 0918    Time Antigen Placed  4098    Allergen Manufacturer  Waynette Buttery    Location  Back    Number of Test  59    Panel 1  Select    1. Control-Buffer 50% Glycerol  Negative    2. Control-Histamine 1 mg/ml  3+    3. Albumin saline  Negative    4. Bahia  Negative    5. French Southern Territ[place]  Negative    6. Johnson  Negative    7. Kentucky Blue  Negative    8. Meadow Fescue  Negative    9. Perennial Rye  Negative    10. Sweet Vernal  Negative    11. Timothy  Negative    12. Cocklebur  Negative    13. Burweed Marshelder  Negative    14. Ragweed, short  Negative    15. Ragweed, Giant  Negative  16. Plantain,  English  Negative    17. Lamb's Quarters  Negative    18. Sheep Sorrell  Negative    19. Rough Pigweed  Negative    20. Marsh Elder, Rough  Negative    21. Mugwort, Common  Negative    22. Ash mix  Negative    23. Birch mix  Negative    24. Beech American  Negative    25. Box, Elder  Negative    26. Cedar, red  Negative    27. Cottonwood, Guinea-BissauEastern  Negative    28. Elm mix  Negative    29. Hickory mix  Negative    30. Maple mix  Negative    31. Oak, Guinea-BissauEastern mix  Negative    32.  Pecan Pollen  Negative    33. Pine mix  Negative    34. Sycamore Eastern  Negative    35. Walnut, Black Pollen  Negative    36. Alternaria alternata  Negative    37. Cladosporium Herbarum  Negative    38. Aspergillus mix  Negative    39. Penicillium mix  Negative    40. Bipolaris sorokiniana (Helminthosporium)  Negative    41. Drechslera spicifera (Curvularia)  Negative    42. Mucor plumbeus  Negative    43. Fusarium moniliforme  Negative    44. Aureobasidium pullulans (pullulara)  Negative    45. Rhizopus oryzae  Negative    46. Botrytis cinera  Negative    47. Epicoccum nigrum  Negative    48. Phoma betae  Negative    49. Candida Albicans  Negative    50. Trichophyton mentagrophytes  Negative    51. Mite, D Farinae  5,000 AU/ml  4+    52. Mite, D Pteronyssinus  5,000 AU/ml  4+    53. Cat Hair 10,000 BAU/ml  Negative    54.  Dog Epithelia  Negative    55. Mixed Feathers  Negative    56. Horse Epithelia  Negative    57. Cockroach, German  4+    58. Mouse  Negative    59. Tobacco Leaf  Negative     Intradermal - 12/19/18 1014    Time Antigen Placed  [id number]    Allergen Manufacturer  Waynette ButteryGreer    Location  Arm    Number of Test  13    Intradermal  Select    Control  Negative    [place] Southern Territ[place]  2+    Johnson  3+    7 Grass  Negative    Ragweed mix  2+    Weed mix  2+    Tree mix  4+    Mold 1  Negative    Mold 2  Negative    Mold 3  Negative    Mold 4  2+    Cat  2+    Dog  Negative       Past Medical History: Patient Active Problem List   Diagnosis Date Noted  . Other allergic rhinitis 12/19/2018   Past Medical History:  Diagnosis Date  . Anemia   . Ovarian cyst    Past Surgical History: Past Surgical History:  Procedure Laterality Date  . CESAREAN SECTION N/A 12/29/2015   Procedure: CESAREAN SECTION;  Surgeon: Lesly DukesKelly H Leggett, MD;  Location: [facility];  Service: Obstetrics;  Laterality: N/A;   Medication List:  Current Outpatient Medications  Medication Sig Dispense  Refill  . loratadine (CLARITIN) 10 MG tablet TAKE 1 TABLET  BY MOUTH EVERY DAY IN THE EVENING    . fluticasone (FLONASE) 50 MCG/ACT nasal spray Place 2 sprays into both nostrils daily. 16 g 3  . montelukast (SINGULAIR) 10 MG tablet Take 1 tablet (10 mg total) by mouth at bedtime. 30 tablet 5  . norethindrone (MICRONOR,CAMILA,ERRIN) 0.35 MG tablet Take 1 tablet (0.35 mg total) by mouth daily. 1 Package 11   No current facility-administered medications for this visit.    Allergies: No Known Allergies Social History: Social History   Socioeconomic History  . Marital status: Married    Spouse name: Not on file  . Number of children: Not on file  . Years of education: Not on file  . Highest education level: Not on file  Occupational History  . Not on file  Social Needs  . Financial resource strain: Not on file  . Food insecurity:    Worry: Not on file    Inability: Not on file  . Transportation needs:    Medical: Not on file    Non-medical: Not on file  Tobacco Use  . Smoking status: Never Smoker  . Smokeless tobacco: Never Used  Substance and Sexual Activity  . Alcohol use: No  . Drug use: No  . Sexual activity: Yes    Birth control/protection: None  Lifestyle  . Physical activity:    Days per week: Not on file    Minutes per session: Not on file  . Stress: Not on file  Relationships  . Social connections:    Talks on phone: Not on file    Gets together: Not on file    Attends religious service: Not on file    Active member of club or organization: Not on file    Attends meetings of clubs or organizations: Not on file    Relationship status: Not on file  Other Topics Concern  . Not on file  Social History Narrative  . Not on file   Lives in a townhome. Smoking: denies Occupation: not employed  Environmental HistorySurveyor, minerals in the house: no Engineer, civil (consulting) in the family room: no Carpet in the bedroom: no Heating: electric Cooling: central Pet:  no  Family History: Family History  Problem Relation Age of Onset  . Diabetes Mother   . Hypertension Mother   . Hypertension Maternal Grandmother   . Diabetes Maternal Grandmother   . Hypertension Maternal Grandfather   . Diabetes Maternal Grandfather   . Hypertension Maternal Aunt   . Diabetes Maternal Aunt   . Hypertension Maternal Uncle   . Diabetes Maternal Uncle   . Cancer Neg Hx   . Heart disease Neg Hx   . Stroke Neg Hx    Problem                               Relation Asthma                                   No  Eczema                                Children  Food allergy                          No  Allergic rhino conjunctivitis  No   Review of Systems  Constitutional: Negative for appetite change, chills, fever and unexpected weight change.  HENT: Positive for congestion, rhinorrhea and sneezing.   Eyes: Positive for itching.  Respiratory: Negative for cough, chest tightness, shortness of breath and wheezing.   Cardiovascular: Negative for chest pain.  Gastrointestinal: Negative for abdominal pain.  Genitourinary: Negative for difficulty urinating.  Skin: Negative for rash.  Allergic/Immunologic: Negative for environmental allergies and food allergies.  Neurological: Negative for headaches.   Objective: BP 110/60   Pulse 76   Resp 16   Ht 5' (1.524 m)   Wt 123 lb 3.2 oz (55.9 kg)   BMI 24.06 kg/m  Body mass index is 24.06 kg/m. Physical Exam  Constitutional: She is oriented to person, place, and time. She appears well-developed and well-nourished.  HENT:  Head: Normocephalic and atraumatic.  Right Ear: External ear normal.  Left Ear: External ear normal.  Nose: Mucosal edema and rhinorrhea present.  Mouth/Throat: Oropharynx is clear and moist.  Eyes: Conjunctivae and EOM are normal.  Neck: Neck supple.  Cardiovascular: Normal rate, regular rhythm and normal heart sounds. Exam reveals no gallop and no friction rub.  No murmur  heard. Pulmonary/Chest: Effort normal and breath sounds normal. She has no wheezes. She has no rales.  Abdominal: Soft. Bowel sounds are normal. There is no abdominal tenderness.  Lymphadenopathy:    She has no cervical adenopathy.  Neurological: She is alert and oriented to person, place, and time.  Skin: Skin is warm. No rash noted.  Psychiatric: She has a normal mood and affect. Her behavior is normal.  Nursing note and vitals reviewed.  The plan was reviewed with the patient/family, and all questions/concerned were addressed.  It was my pleasure to see [facility] today and participate in her care. Please feel free to contact me with any questions or concerns.  Sincerely,  Wyline Mood, DO Allergy & Immunology  [facility] office: [phone] [facility] office: [phone]

## 2018-12-19 NOTE — Assessment & Plan Note (Signed)
Perennial rhinitis symptoms for the past 20+ years but worse the past 2 years. Tried Claritin and Flonase inconsistently with some benefit.  Today's testing showed: Positive to grass, ragweed, weed, trees, mold dust mites, cat, cockroach. Discussed environmental control measures.   Start using Flonase 2 sprays daily every day. Demonstrated proper use.  Nasal saline spray (i.e., Simply Saline) or nasal saline lavage (i.e., NeilMed) is recommended as needed and prior to medicated nasal sprays.  Start Singulair 10mg  daily.  May use over the counter antihistamines such as Zyrtec (cetirizine), Claritin (loratadine), Allegra (fexofenadine), or Xyzal (levocetirizine) daily as needed.  If above regimen does not control symptoms then will discuss allergy immunotherapy in more detail at next visit.

## 2018-12-19 NOTE — Patient Instructions (Addendum)
Today's testing showed: Positive to grass, ragweed, weed, trees, mold dust mites, cat, cockroach.  Start using Flonase 2 sprays daily every day.   Nasal saline spray (i.e., Simply Saline) or nasal saline lavage (i.e., NeilMed) is recommended as needed and prior to medicated nasal sprays.  Start singulair 10mg  daily.  May use over the counter antihistamines such as Zyrtec (cetirizine), Claritin (loratadine), Allegra (fexofenadine), or Xyzal (levocetirizine) daily as needed.  Follow up in 2 months  Control of House Dust Mite Allergen . Dust mite allergens are a common trigger of allergy and asthma symptoms. While they can be found throughout the house, these microscopic creatures thrive in warm, humid environments such as bedding, upholstered furniture and carpeting. . Because so much time is spent in the bedroom, it is essential to reduce mite levels there.  . Encase pillows, mattresses, and box springs in special allergen-proof fabric covers or airtight, zippered plastic covers.  . Bedding should be washed weekly in hot water (130 F) and dried in a hot dryer. Allergen-proof covers are available for comforters and pillows that can't be regularly washed.  Reyes Ivan. Wash the allergy-proof covers every few months. Minimize clutter in the bedroom. Keep pets out of the bedroom.  Marland Kitchen. Keep humidity less than 50% by using a dehumidifier or air conditioning. You can buy a humidity measuring device called a hygrometer to monitor this.  . If possible, replace carpets with hardwood, linoleum, or washable area rugs. If that's not possible, vacuum frequently with a vacuum that has a HEPA filter. . Remove all upholstered furniture and non-washable window drapes from the bedroom. . Remove all non-washable stuffed toys from the bedroom.  Wash stuffed toys weekly. Reducing Pollen Exposure . Pollen seasons: trees (spring), grass (summer) and ragweed/weeds (fall). Marland Kitchen. Keep windows closed in your home and car to lower  pollen exposure.  Lilian Kapur. Install air conditioning in the bedroom and throughout the house if possible.  . Avoid going out in dry windy days - especially early morning. . Pollen counts are highest between 5 - 10 AM and on dry, hot and windy days.  . Save outside activities for late afternoon or after a heavy rain, when pollen levels are lower.  . Avoid mowing of grass if you have grass pollen allergy. Marland Kitchen. Be aware that pollen can also be transported indoors on people and pets.  . Dry your clothes in an automatic dryer rather than hanging them outside where they might collect pollen.  . Rinse hair and eyes before bedtime. Cockroach Allergen Avoidance Cockroaches are often found in the homes of densely populated urban areas, schools or commercial buildings, but these creatures can lurk almost anywhere. This does not mean that you have a dirty house or living area. . Block all areas where roaches can enter the home. This includes crevices, wall cracks and windows.  . Cockroaches need water to survive, so fix and seal all leaky faucets and pipes. Have an exterminator go through the house when your family and pets are gone to eliminate any remaining roaches. Marland Kitchen. Keep food in lidded containers and put pet food dishes away after your pets are done eating. Vacuum and sweep the floor after meals, and take out garbage and recyclables. Use lidded garbage containers in the kitchen. Wash dishes immediately after use and clean under stoves, refrigerators or toasters where crumbs can accumulate. Wipe off the stove and other kitchen surfaces and cupboards regularly. Pet Allergen Avoidance: . Contrary to popular opinion, there are no "hypoallergenic" breeds  of dogs or cats. That is because people are not allergic to an animal's hair, but to an allergen found in the animal's saliva, dander (dead skin flakes) or urine. Pet allergy symptoms typically occur within minutes. For some people, symptoms can build up and become most  severe 8 to 12 hours after contact with the animal. People with severe allergies can experience reactions in public places if dander has been transported on the pet owners' clothing. Marland Kitchen. Keeping an animal outdoors is only a partial solution, since homes with pets in the yard still have higher concentrations of animal allergens. . Before getting a pet, ask your allergist to determine if you are allergic to animals. If your pet is already considered part of your family, try to minimize contact and keep the pet out of the bedroom and other rooms where you spend a great deal of time. . As with dust mites, vacuum carpets often or replace carpet with a hardwood floor, tile or linoleum. . High-efficiency particulate air (HEPA) cleaners can reduce allergen levels over time. . While dander and saliva are the source of cat and dog allergens, urine is the source of allergens from rabbits, hamsters, mice and [place]guinea pigs; so ask a non-allergic family member to clean the animal's cage. . If you have a pet allergy, talk to your allergist about the potential for allergy immunotherapy (allergy shots). This strategy can often provide long-term relief. Mold Control . Mold and fungi can grow on a variety of surfaces provided certain temperature and moisture conditions exist.  . Outdoor molds grow on plants, decaying vegetation and soil. The major outdoor mold, Alternaria and Cladosporium, are found in very high numbers during hot and dry conditions. Generally, a late summer - fall peak is seen for common outdoor fungal spores. Rain will temporarily lower outdoor mold spore count, but counts rise rapidly when the rainy period ends. . The most important indoor molds are Aspergillus and Penicillium. Dark, humid and poorly ventilated basements are ideal sites for mold growth. The next most common sites of mold growth are the bathroom and the kitchen. Outdoor (Seasonal) Mold Control . Use air conditioning and keep windows  closed. . Avoid exposure to decaying vegetation. Marland Kitchen. Avoid leaf raking. . Avoid grain handling. . Consider wearing a face mask if working in moldy areas.  Indoor (Perennial) Mold Control  . Maintain humidity below 50%. . Get rid of mold growth on hard surfaces with water, detergent and, if necessary, 5% bleach (do not mix with other cleaners). Then dry the area completely. If mold covers an area more than 10 square feet, consider hiring an indoor environmental professional. . For clothing, washing with soap and water is best. If moldy items cannot be cleaned and dried, throw them away. . Remove sources e.g. contaminated carpets. . Repair and seal leaking roofs or pipes. Using dehumidifiers in damp basements may be helpful, but empty the water and clean units regularly to prevent mildew from forming. All rooms, especially basements, bathrooms and kitchens, require ventilation and cleaning to deter mold and mildew growth. Avoid carpeting on concrete or damp floors, and storing items in damp areas.

## 2019-02-20 ENCOUNTER — Ambulatory Visit: Payer: Medicaid Other | Admitting: Allergy

## 2019-12-28 ENCOUNTER — Ambulatory Visit (INDEPENDENT_AMBULATORY_CARE_PROVIDER_SITE_OTHER): Payer: Medicaid Other | Admitting: Obstetrics & Gynecology

## 2019-12-28 ENCOUNTER — Encounter: Payer: Self-pay | Admitting: Obstetrics & Gynecology

## 2019-12-28 ENCOUNTER — Other Ambulatory Visit: Payer: Self-pay

## 2019-12-28 VITALS — BP 108/72 | HR 96 | Ht <= 58 in | Wt 124.0 lb

## 2019-12-28 DIAGNOSIS — Z3046 Encounter for surveillance of implantable subdermal contraceptive: Secondary | ICD-10-CM

## 2019-12-28 MED ORDER — NORGESTREL-ETHINYL ESTRADIOL 0.3-30 MG-MCG PO TABS: 1.0000 | ORAL_TABLET | Freq: Every day | ORAL | 11 refills | Status: DC

## 2019-12-28 NOTE — Progress Notes (Signed)
   Subjective:    Patient ID: Claudia [facility], female    DOB: 08-13-1996, 24 y.o.   MRN: [id number]  HPI 24 yo P2 here to have her Nexplanon removed. She wants to change to OCPs because of irregular bleeding. She plans to stop breast feeding her 24 yo next week.  Review of Systems Pap normal 4/19    Objective:   Physical Exam Breathing, conversing, and ambulating normally Well nourished, well hydrated Black female, no apparent distress UPT was negative. Consent was signed. Time out procedure was done. Her left arm was prepped with betadine and infiltrated with 3 cc of 1% lidocaine. After adequate anesthesia was assured, the Nexplanon device was placed according to standard of care. Her arm was hemostatic and was bandaged. She tolerated the procedure well.     Assessment & Plan:  Contraception- start OCPs today and use condoms for 4 weeks

## 2020-01-01 ENCOUNTER — Encounter: Payer: Self-pay | Admitting: General Practice

## 2020-01-01 NOTE — Progress Notes (Unsigned)
Patient came by office today reporting reaction to having her Nexplanon taken out. 4 small blisters in various stages of healing present around where Nexplanon was removed- appears to be allergic reaction to co band used. Incision itself is healing normally. Dr Shawnie Pons viewed blisters and recommended allegra and zantac for several days. Discussed with patient. Patient verbalized understanding.

## 2020-02-08 ENCOUNTER — Encounter: Payer: Self-pay | Admitting: General Practice

## 2021-02-06 ENCOUNTER — Telehealth: Payer: Self-pay | Admitting: Family Medicine

## 2021-02-06 MED ORDER — NORGESTREL-ETHINYL ESTRADIOL 0.3-30 MG-MCG PO TABS: 1.0000 | ORAL_TABLET | Freq: Every day | ORAL | 2 refills | Status: DC

## 2021-02-06 NOTE — Telephone Encounter (Signed)
Called patient with assistance of [employer] Arabic [employer]preter, # [id number].   Reordered prescription per standing order.   Called to inform patient that refill has been called to her pharmacy for 3 months and that she needs a follow up appointment for annual exam for further refills.   Reviewed that if she has had a lapse in her pills before getting the new prescription she will want to use a back up method of birth control until she gets through her first pack of new pills. Client reports she is on the last of her last pack of pills and there is not lapse in taking.   Patient with no other questions or concerns at this time.

## 2021-02-06 NOTE — Telephone Encounter (Signed)
Pt states that she is out of birth control and has not had any since Feb and needs refil

## 2021-02-06 NOTE — Addendum Note (Signed)
Addended by: Ed Blalock on: 02/06/2021 12:33 PM   Modules accepted: Orders

## 2021-05-23 LAB — OB RESULTS CONSOLE GBS: GBS: POSITIVE

## 2021-05-23 LAB — OB RESULTS CONSOLE PLATELET COUNT: Platelets: 248

## 2021-05-23 LAB — OB RESULTS CONSOLE HGB/HCT, BLOOD
HCT: 34 (ref 29–41)
Hemoglobin: 11.9

## 2021-07-24 ENCOUNTER — Encounter: Payer: Self-pay | Admitting: Medical

## 2021-07-24 ENCOUNTER — Telehealth: Payer: Self-pay | Admitting: Family Medicine

## 2021-07-24 ENCOUNTER — Other Ambulatory Visit: Payer: Self-pay

## 2021-07-24 ENCOUNTER — Ambulatory Visit (INDEPENDENT_AMBULATORY_CARE_PROVIDER_SITE_OTHER): Payer: Medicaid Other | Admitting: Family Medicine

## 2021-07-24 VITALS — BP 111/70 | HR 80 | Wt 133.2 lb

## 2021-07-24 DIAGNOSIS — Z3480 Encounter for supervision of other normal pregnancy, unspecified trimester: Secondary | ICD-10-CM

## 2021-07-24 DIAGNOSIS — Z789 Other specified health status: Secondary | ICD-10-CM

## 2021-07-24 DIAGNOSIS — Z98891 History of uterine scar from previous surgery: Secondary | ICD-10-CM

## 2021-07-24 DIAGNOSIS — Z3490 Encounter for supervision of normal pregnancy, unspecified, unspecified trimester: Secondary | ICD-10-CM | POA: Insufficient documentation

## 2021-07-24 MED ORDER — BLOOD PRESSURE KIT DEVI: 1.0000 | 0 refills | Status: DC | PRN

## 2021-07-24 MED ORDER — PREPLUS 27-1 MG PO TABS: 1.0000 | ORAL_TABLET | Freq: Every day | ORAL | 12 refills | Status: AC

## 2021-07-24 MED ORDER — GOJJI WEIGHT SCALE MISC: 1.0000 | 0 refills | Status: DC | PRN

## 2021-07-24 NOTE — Progress Notes (Signed)
Subjective:   Claudia Lucas is a 25 y.o. G3P2002 at [redacted]w[redacted]d by LMP being seen today for her first obstetrical visit.  Her obstetrical history is significant for  GBS positive, Hx of prior cesarean section (due to failure to descend), one prior VBAC  . Patient does intend to breast feed. Pregnancy history fully reviewed.  Patient reports nausea, no bleeding, no contractions, no cramping, and no leaking.  HISTORY: OB History  Gravida Para Term Preterm AB Living  3 2 2  0 0 2  SAB IAB Ectopic Multiple Live Births  0 0 0 0 2    # Outcome Date GA Lbr Len/2nd Weight Sex Delivery Anes PTL Lv  3 Current           2 Term 02/06/18   5 lb (2.268 kg) F Vag-Spont   LIV  1 Term 12/29/15 [redacted]w[redacted]d 16:16 / 05:12 6 lb 4.2 oz (2.84 kg) M CS-LTranv EPI  LIV     Birth Comments: NICU stay for fever and poor feeding.  Neg sepsis w/u     Name: Philipp,BOY Lourene     Apgar1: 7  Apgar5: 8   Last pap smear on file was  02/2018 and was normal - patient reports also had PAP at outside facility in the past year. Records requested.   Past Medical History:  Diagnosis Date   Anemia    Ovarian cyst    Past Surgical History:  Procedure Laterality Date   CESAREAN SECTION N/A 12/29/2015   Procedure: CESAREAN SECTION;  Surgeon: 12/31/2015, MD;  Location: [facility];  Service: Obstetrics;  Laterality: N/A;   Family History  Problem Relation Age of Onset   Diabetes Mother    Hypertension Mother    Hypertension Maternal Grandmother    Diabetes Maternal Grandmother    Hypertension Maternal Grandfather    Diabetes Maternal Grandfather    Hypertension Maternal Aunt    Diabetes Maternal Aunt    Hypertension Maternal Uncle    Diabetes Maternal Uncle    Cancer Neg Hx    Heart disease Neg Hx    Stroke Neg Hx    Social History   Tobacco Use   Smoking status: Never   Smokeless tobacco: Never  Substance Use Topics   Alcohol use: No   Drug use: No   No Active Allergies Current Outpatient Medications on File  Prior to Visit  Medication Sig Dispense Refill   fluticasone (FLONASE) 50 MCG/ACT nasal spray Place 2 sprays into both nostrils daily. (Patient not taking: Reported on 07/24/2021) 16 g 3   loratadine (CLARITIN) 10 MG tablet TAKE 1 TABLET BY MOUTH EVERY DAY IN THE EVENING (Patient not taking: Reported on 07/24/2021)     No current facility-administered medications on file prior to visit.     Exam   Vitals:   07/24/21 1410  BP: 111/70  Pulse: 80  Weight: 133 lb 3.2 oz (60.4 kg)   Fetal Heart Rate (bpm): 155  Uterus:     Pelvic Exam: Perineum: no hemorrhoids, normal perineum   Vulva: normal external genitalia, no lesions   Vagina:  normal mucosa, normal discharge   Cervix: no lesions and normal, pap smear done.    Adnexa: normal adnexa and no mass, fullness, tenderness   Bony Pelvis: average  System: General: well-developed, well-nourished female in no acute distress   Breast:  normal appearance, no masses or tenderness   Skin: normal coloration and turgor, no rashes   Neurologic: oriented, normal, negative,  normal mood   Extremities: normal strength, tone, and muscle mass, ROM of all joints is normal   HEENT PERRLA, extraocular movement intact and sclera clear, anicteric   Mouth/Teeth mucous membranes moist, pharynx normal without lesions and dental hygiene good   Neck supple and no masses   Cardiovascular: regular rate and rhythm   Respiratory:  no respiratory distress, normal breath sounds   Abdomen: soft, non-tender; bowel sounds normal; no masses,  no organomegaly     Assessment:   Pregnancy: [id number] Patient Active Problem List   Diagnosis Date Noted   Supervision of low-risk pregnancy 07/24/2021   Other allergic rhinitis 12/19/2018   History of cesarean section 12/31/2015     Plan:  1. Language barrier In person Arabic translator present  2. Encounter for supervision of low-risk pregnancy, antepartum Presents for New OB. Heart tone measured on beside [place] (~155).  Has not started prenatal vitamins yet, will send now. No other concerns at this time. - CBC/D/Plt+RPR+Rh+ABO+RubIgG... - Genetic Screening - US MFM OB DETAIL +14 WK; Future - Culture, OB Urine - Hemoglobin A1c - Prenatal Vit-Fe Fumarate-FA (PREPLUS) 27-1 MG TABS; Take 1 each by mouth daily.  Dispense: 30 tablet; Refill: 12  3. Hx of prior cesarean section For arrest to descent. Succesful VBACx1 since CS. Discuss and assess desire for VBAC at next visit.  4. Due for PAP Reports had PAP done at outside facility recently. Requested records.   Initial labs drawn. Continue prenatal vitamins. Genetic Screening discussed, Quad screen and NIPS: ordered. Ultrasound discussed; fetal anatomic survey: ordered. Problem list reviewed and updated. The nature of [place] [facility] Faculty Practice with multiple MDs and other Advanced Practice Providers was explained to patient; also emphasized that residents, students are part of our team. Routine obstetric precautions reviewed. Return in about 4 weeks (around 08/21/2021) for LROB with female provider only.  Warner Mccreedy, MD, MPH OB Fellow, Faculty Practice

## 2021-07-25 ENCOUNTER — Encounter: Payer: Self-pay | Admitting: *Deleted

## 2021-07-25 ENCOUNTER — Other Ambulatory Visit: Payer: Self-pay | Admitting: Family Medicine

## 2021-07-25 DIAGNOSIS — Z3490 Encounter for supervision of normal pregnancy, unspecified, unspecified trimester: Secondary | ICD-10-CM

## 2021-07-25 LAB — CBC/D/PLT+RPR+RH+ABO+RUBIGG...
Antibody Screen: NEGATIVE
Basophils Absolute: 0 10*3/uL (ref 0.0–0.2)
Basos: 0 %
EOS (ABSOLUTE): 0.2 10*3/uL (ref 0.0–0.4)
Eos: 4 %
HCV Ab: <0.1 s/co ratio (ref 0.0–0.9)
HIV Screen 4th Generation wRfx: NONREACTIVE
Hematocrit: 33.9 % — ABNORMAL LOW (ref 34.0–46.6)
Hemoglobin: 11.4 g/dL (ref 11.1–15.9)
Hepatitis B Surface Ag: NEGATIVE
Immature Grans (Abs): 0 10*3/uL (ref 0.0–0.1)
Immature Granulocytes: 0 %
Lymphocytes Absolute: 1.6 10*3/uL (ref 0.7–3.1)
Lymphs: 32 %
MCH: 30.9 pg (ref 26.6–33.0)
MCHC: 33.6 g/dL (ref 31.5–35.7)
MCV: 92 fL (ref 79–97)
Monocytes Absolute: 0.4 10*3/uL (ref 0.1–0.9)
Monocytes: 8 %
Neutrophils Absolute: 2.7 10*3/uL (ref 1.4–7.0)
Neutrophils: 56 %
Platelets: 245 10*3/uL (ref 150–450)
RBC: 3.69 x10E6/uL — ABNORMAL LOW (ref 3.77–5.28)
RDW: 12.6 % (ref 11.7–15.4)
RPR Ser Ql: NONREACTIVE
Rh Factor: POSITIVE
Rubella Antibodies, IGG: 11.7 index (ref >0.99)
WBC: 4.9 10*3/uL (ref 3.4–10.8)

## 2021-07-25 LAB — HEMOGLOBIN A1C
Est. average glucose Bld gHb Est-mCnc: 94 mg/dL
Hgb A1c MFr Bld: 4.9 % (ref 4.8–5.6)

## 2021-07-25 LAB — HCV INTERPRETATION

## 2021-07-28 LAB — CULTURE, OB URINE

## 2021-07-28 LAB — URINE CULTURE, OB REFLEX

## 2021-07-29 ENCOUNTER — Other Ambulatory Visit: Payer: Self-pay | Admitting: Family Medicine

## 2021-07-29 DIAGNOSIS — Z3490 Encounter for supervision of normal pregnancy, unspecified, unspecified trimester: Secondary | ICD-10-CM

## 2021-07-29 DIAGNOSIS — R8271 Bacteriuria: Secondary | ICD-10-CM | POA: Insufficient documentation

## 2021-07-30 ENCOUNTER — Encounter: Payer: Self-pay | Admitting: Family Medicine

## 2021-08-01 ENCOUNTER — Telehealth: Payer: Self-pay

## 2021-08-01 NOTE — Telephone Encounter (Addendum)
-----   Message from Warner Mccreedy, MD sent at 07/31/2021 11:15 PM EDT ----- Regarding: Request to discuss urine results with patient Hi  Could someone call this Arabic speaking patient and let her know the following: Her Urine showed GBS bacteria but not enough  to given her any antibiotics at this time. She will be treated with penicillin during labor.  Thanks! Dr. Ephriam Jenkins ----- Message ----- From: Interface, [facility] Lab Results In Sent: 07/25/2021   5:37 AM EDT To: Warner Mccreedy, MD  Called pt with [facility] # [phone] and left message that she has a UTI that does not need treating.  We will discuss your results at your appt on 08/22/21 @ 0815.  If she has questions to please give the office a call back  Addison Naegeli, RN 08/01/21

## 2021-08-18 ENCOUNTER — Encounter: Payer: Self-pay | Admitting: General Practice

## 2021-08-22 ENCOUNTER — Ambulatory Visit (INDEPENDENT_AMBULATORY_CARE_PROVIDER_SITE_OTHER): Payer: Medicaid Other | Admitting: Certified Nurse Midwife

## 2021-08-22 ENCOUNTER — Other Ambulatory Visit: Payer: Self-pay

## 2021-08-22 VITALS — BP 101/60 | HR 84 | Wt 137.0 lb

## 2021-08-22 DIAGNOSIS — Z23 Encounter for immunization: Secondary | ICD-10-CM

## 2021-08-22 DIAGNOSIS — R8271 Bacteriuria: Secondary | ICD-10-CM

## 2021-08-22 DIAGNOSIS — Z3490 Encounter for supervision of normal pregnancy, unspecified, unspecified trimester: Secondary | ICD-10-CM

## 2021-08-22 DIAGNOSIS — Z3492 Encounter for supervision of normal pregnancy, unspecified, second trimester: Secondary | ICD-10-CM

## 2021-08-22 DIAGNOSIS — Z3A16 16 weeks gestation of pregnancy: Secondary | ICD-10-CM

## 2021-08-22 MED ORDER — AMOXICILLIN 500 MG PO CAPS: 500.0000 mg | ORAL_CAPSULE | Freq: Three times a day (TID) | ORAL | 0 refills | Status: AC

## 2021-08-22 NOTE — Progress Notes (Signed)
   PRENATAL VISIT NOTE  Subjective:  Claudia Lucas is a 25 y.o. G3P2002 at [redacted]w[redacted]d being seen today for ongoing prenatal care.  She is currently monitored for the following issues for this low-risk pregnancy and has History of cesarean section; Other allergic rhinitis; Supervision of low-risk pregnancy; and GBS bacteriuria on their problem list.  Patient reports no complaints but wanted to know the best form of birth control if she wants to prevent pregnancy for the next 7 years or so.  Contractions: Not present. Vag. Bleeding: None.  Movement: Absent. Denies leaking of fluid.   The following portions of the patient's history were reviewed and updated as appropriate: allergies, current medications, past family history, past medical history, past social history, past surgical history and problem list.   Arabic interpreter Ralene Ok) present for interpretation services throughout visit. Objective:   Vitals:   08/22/21 0833  BP: 101/60  Pulse: 84  Weight: 137 lb (62.1 kg)   Fetal Status: Fetal Heart Rate (bpm): 140   Movement: Absent     General:  Alert, oriented and cooperative. Patient is in no acute distress.  Skin: Skin is warm and dry. No rash noted.   Cardiovascular: Normal heart rate noted  Respiratory: Normal respiratory effort, no problems with respiration noted  Abdomen: Soft, gravid, appropriate for gestational age.  Pain/Pressure: Absent     Pelvic: Cervical exam deferred        Extremities: Normal range of motion.  Edema: None  Mental Status: Normal mood and affect. Normal behavior. Normal judgment and thought content.   Assessment and Plan:  Pregnancy: G3P2002 at [redacted]w[redacted]d 1. Supervision of low-risk pregnancy, second trimester - Doing well, not yet feeling fetal movement - Recommended pp IUD, pt agreed and requested Liletta.  2. [redacted] weeks gestation of pregnancy - Routine OB care  3. GBS bacteriuria - Previous result noted, standard for [facility] is to treat GBS bacteriuria  >10,000 in pregnancy, pt informed and prescription sent. - amoxicillin (AMOXIL) 500 MG capsule; Take 1 capsule (500 mg total) by mouth 3 (three) times daily for 7 days.  Dispense: 21 capsule; Refill: 0  Preterm labor symptoms and general obstetric precautions including but not limited to vaginal bleeding, contractions, leaking of fluid and fetal movement were reviewed in detail with the patient. Please refer to After Visit Summary for other counseling recommendations.   Return in about 4 weeks (around 09/19/2021) for LOB, IN-PERSON.  Future Appointments  Date Time Provider Department Center  09/08/2021  8:45 AM [facility] NURSE [facility]  09/08/2021  9:00 AM [facility] US1 [facility]  09/18/2021  8:15 AM Bernerd Limbo, CNM [facility]    Bernerd Limbo, CNM

## 2021-09-08 ENCOUNTER — Ambulatory Visit: Payer: Medicaid Other | Attending: Family Medicine

## 2021-09-08 ENCOUNTER — Ambulatory Visit: Payer: Medicaid Other

## 2021-09-08 ENCOUNTER — Other Ambulatory Visit: Payer: Self-pay

## 2021-09-08 ENCOUNTER — Other Ambulatory Visit: Payer: Self-pay | Admitting: *Deleted

## 2021-09-08 ENCOUNTER — Ambulatory Visit: Payer: Medicaid Other | Admitting: *Deleted

## 2021-09-08 VITALS — BP 99/65 | HR 84

## 2021-09-08 DIAGNOSIS — Z362 Encounter for other antenatal screening follow-up: Secondary | ICD-10-CM

## 2021-09-08 DIAGNOSIS — Z3490 Encounter for supervision of normal pregnancy, unspecified, unspecified trimester: Secondary | ICD-10-CM | POA: Diagnosis not present

## 2021-09-08 DIAGNOSIS — Z3492 Encounter for supervision of normal pregnancy, unspecified, second trimester: Secondary | ICD-10-CM | POA: Diagnosis present

## 2021-09-18 ENCOUNTER — Ambulatory Visit (INDEPENDENT_AMBULATORY_CARE_PROVIDER_SITE_OTHER): Payer: Medicaid Other | Admitting: Certified Nurse Midwife

## 2021-09-18 ENCOUNTER — Other Ambulatory Visit: Payer: Self-pay

## 2021-09-18 VITALS — BP 109/62 | HR 89 | Wt 140.6 lb

## 2021-09-18 DIAGNOSIS — Z3A20 20 weeks gestation of pregnancy: Secondary | ICD-10-CM

## 2021-09-18 DIAGNOSIS — Z3492 Encounter for supervision of normal pregnancy, unspecified, second trimester: Secondary | ICD-10-CM

## 2021-09-18 NOTE — Progress Notes (Signed)
   PRENATAL VISIT NOTE  Subjective:  Claudia Lucas is a 25 y.o. G3P2002 at [redacted]w[redacted]d being seen today for ongoing prenatal care.  She is currently monitored for the following issues for this low-risk pregnancy and has History of cesarean section; Other allergic rhinitis; Supervision of low-risk pregnancy; and GBS bacteriuria on their problem list.  Patient reports no complaints.  Contractions: Not present. Vag. Bleeding: None.  Movement: Present. Denies leaking of fluid.   The following portions of the patient's history were reviewed and updated as appropriate: allergies, current medications, past family history, past medical history, past social history, past surgical history and problem list.   Objective:   Vitals:   09/18/21 0828  BP: 109/62  Pulse: 89  Weight: 140 lb 9.6 oz (63.8 kg)    Fetal Status: Fetal Heart Rate (bpm): 161   Movement: Present     General:  Alert, oriented and cooperative. Patient is in no acute distress.  Skin: Skin is warm and dry. No rash noted.   Cardiovascular: Normal heart rate noted  Respiratory: Normal respiratory effort, no problems with respiration noted  Abdomen: Soft, gravid, appropriate for gestational age.  Pain/Pressure: Absent     Pelvic: Cervical exam deferred        Extremities: Normal range of motion.  Edema: None  Mental Status: Normal mood and affect. Normal behavior. Normal judgment and thought content.   Assessment and Plan:  Pregnancy: G3P2002 at [redacted]w[redacted]d 1. Supervision of low-risk pregnancy, second trimester - Doing well, feeling regular and vigorous fetal movement   2. [redacted] weeks gestation of pregnancy - Routine OB care  - Reviewed anatomy scan results  Preterm labor symptoms and general obstetric precautions including but not limited to vaginal bleeding, contractions, leaking of fluid and fetal movement were reviewed in detail with the patient. Please refer to After Visit Summary for other counseling recommendations.   Return in  about 4 weeks (around 10/16/2021) for IN-PERSON, LOB.  Future Appointments  Date Time Provider Department Center  10/07/2021 10:15 AM [facility] NURSE [facility]  10/07/2021 10:30 AM [facility] US3 [facility]    Bernerd Limbo, CNM

## 2021-10-07 ENCOUNTER — Ambulatory Visit: Payer: Medicaid Other | Attending: Obstetrics and Gynecology

## 2021-10-07 ENCOUNTER — Other Ambulatory Visit: Payer: Self-pay

## 2021-10-07 ENCOUNTER — Encounter: Payer: Self-pay | Admitting: *Deleted

## 2021-10-07 ENCOUNTER — Ambulatory Visit: Payer: Medicaid Other | Admitting: *Deleted

## 2021-10-07 VITALS — BP 103/64 | HR 100

## 2021-10-07 DIAGNOSIS — O34219 Maternal care for unspecified type scar from previous cesarean delivery: Secondary | ICD-10-CM | POA: Diagnosis not present

## 2021-10-07 DIAGNOSIS — Z3492 Encounter for supervision of normal pregnancy, unspecified, second trimester: Secondary | ICD-10-CM

## 2021-10-07 DIAGNOSIS — Z3A22 22 weeks gestation of pregnancy: Secondary | ICD-10-CM

## 2021-10-07 DIAGNOSIS — Z362 Encounter for other antenatal screening follow-up: Secondary | ICD-10-CM | POA: Diagnosis present

## 2021-10-14 ENCOUNTER — Ambulatory Visit (INDEPENDENT_AMBULATORY_CARE_PROVIDER_SITE_OTHER): Payer: Medicaid Other | Admitting: Family Medicine

## 2021-10-14 ENCOUNTER — Other Ambulatory Visit: Payer: Self-pay

## 2021-10-14 ENCOUNTER — Encounter: Payer: Self-pay | Admitting: Family Medicine

## 2021-10-14 VITALS — BP 96/60 | HR 88 | Wt 141.0 lb

## 2021-10-14 DIAGNOSIS — J302 Other seasonal allergic rhinitis: Secondary | ICD-10-CM

## 2021-10-14 DIAGNOSIS — Z3492 Encounter for supervision of normal pregnancy, unspecified, second trimester: Secondary | ICD-10-CM

## 2021-10-14 DIAGNOSIS — Z3A23 23 weeks gestation of pregnancy: Secondary | ICD-10-CM

## 2021-10-14 MED ORDER — CETIRIZINE HCL 10 MG PO TABS: 10.0000 mg | ORAL_TABLET | Freq: Every day | ORAL | 3 refills | Status: DC | PRN

## 2021-10-14 NOTE — Progress Notes (Addendum)
    Subjective:  Claudia Lucas is a 25 y.o. G3P2002 at [redacted]w[redacted]d being seen today for ongoing prenatal care.  She is currently monitored for the following issues for this low-risk pregnancy and has History of cesarean section; Other allergic rhinitis; Supervision of low-risk pregnancy; and GBS bacteriuria on their problem list.  Patient reports  sinus congestion and rhinorrhea secondary to seasonal allergies . Requests medication for this. Patient reports no other complaints.  Contractions: Not present. Vag. Bleeding: None.  Movement: Present. Denies leaking of fluid.   Arabic interpreter present for interpretation services throughout visit.   The following portions of the patient's history were reviewed and updated as appropriate: allergies, current medications, past family history, past medical history, past social history, past surgical history and problem list.   Objective:   Vitals:   10/14/21 0917  BP: 96/60  Pulse: 88  Weight: 141 lb (64 kg)    Fetal Status: Fetal Heart Rate (bpm): 161 Fundal Height: 23 cm Movement: Present     General:  Alert, oriented and cooperative. Patient is in no acute distress.  Skin: Skin is warm and dry. No rash noted.   Cardiovascular: Normal heart rate noted  Respiratory: Normal respiratory effort, no problems with respiration noted  Abdomen: Soft, gravid, appropriate for gestational age. Pain/Pressure: Absent     Pelvic:  Cervical exam deferred        Extremities: Normal range of motion.  Edema: None  Mental Status: Normal mood and affect. Normal behavior. Normal judgment and thought content.   Assessment and Plan:  Pregnancy: G3P2002 at [redacted]w[redacted]d  Supervision of low-risk pregnancy, second trimester - Doing well, feeling regular fetal movements  2. [redacted] weeks gestation of pregnancy - Routine OB care - Reports pap smear done last year at [facility], sent request for records  3. Seasonal allergies - Prescribed cetirizine (ZYRTEC) 10 MG  tablet; Take 1 tablet (10 mg total) by mouth daily as needed for allergies.  Dispense: 30 tablet; Refill: 3  Preterm labor symptoms and general obstetric precautions including but not limited to vaginal bleeding, contractions, leaking of fluid and fetal movement were reviewed in detail with the patient. Please refer to After Visit Summary for other counseling recommendations.  Return in about 4 weeks (around 11/11/2021) for LROB with GTT.  Rayvon Char, PA student    GME ATTESTATION:  I saw and evaluated the patient. I agree with the findings and the plan of care as documented in the resident's note.  Physical exam as above performed by myself.   Leticia Penna, DO OB Fellow, Faculty [facility], Center for [facility] 10/14/2021 11:44 AM  Allayne Stack, DO

## 2021-10-14 NOTE — Patient Instructions (Addendum)
[facility]  [address]  [place]    Safe Medications in Pregnancy   Colds/Coughs/Allergies: Benadryl (alcohol free) 25 mg every 6 hours as needed Breath right strips Claritin Cepacol throat lozenges Chloraseptic throat spray Cold-Eeze- up to three times per day Cough drops, alcohol free Flonase (by prescription only) Guaifenesin Mucinex Robitussin DM (plain only, alcohol free) Saline nasal spray/drops Tylenol Vicks Vaporub Zinc lozenges Zyrtec

## 2021-11-10 ENCOUNTER — Other Ambulatory Visit: Payer: Self-pay | Admitting: *Deleted

## 2021-11-10 DIAGNOSIS — Z3490 Encounter for supervision of normal pregnancy, unspecified, unspecified trimester: Secondary | ICD-10-CM

## 2021-11-12 ENCOUNTER — Ambulatory Visit (INDEPENDENT_AMBULATORY_CARE_PROVIDER_SITE_OTHER): Payer: Medicaid Other | Admitting: Certified Nurse Midwife

## 2021-11-12 ENCOUNTER — Other Ambulatory Visit: Payer: Medicaid Other

## 2021-11-12 ENCOUNTER — Other Ambulatory Visit: Payer: Self-pay

## 2021-11-12 VITALS — BP 103/56 | HR 84 | Wt 143.1 lb

## 2021-11-12 DIAGNOSIS — Z3490 Encounter for supervision of normal pregnancy, unspecified, unspecified trimester: Secondary | ICD-10-CM

## 2021-11-12 DIAGNOSIS — Z23 Encounter for immunization: Secondary | ICD-10-CM

## 2021-11-12 DIAGNOSIS — Z3402 Encounter for supervision of normal first pregnancy, second trimester: Secondary | ICD-10-CM

## 2021-11-12 DIAGNOSIS — Z3A27 27 weeks gestation of pregnancy: Secondary | ICD-10-CM

## 2021-11-12 DIAGNOSIS — Z789 Other specified health status: Secondary | ICD-10-CM

## 2021-11-12 DIAGNOSIS — O219 Vomiting of pregnancy, unspecified: Secondary | ICD-10-CM

## 2021-11-12 MED ORDER — ONDANSETRON 8 MG PO TBDP: 8.0000 mg | ORAL_TABLET | Freq: Three times a day (TID) | ORAL | 0 refills | Status: DC | PRN

## 2021-11-12 NOTE — Progress Notes (Signed)
° °  PRENATAL VISIT NOTE  Subjective:  Claudia Lucas is a 25 y.o. G3P2002 at [redacted]w[redacted]d being seen today for ongoing prenatal care.  She is currently monitored for the following issues for this low-risk pregnancy and has History of cesarean section; Other allergic rhinitis; Supervision of low-risk pregnancy; and GBS bacteriuria on their problem list.  Patient reports no complaints.  Contractions: Not present. Vag. Bleeding: None.  Movement: Present. Denies leaking of fluid.   The following portions of the patient's history were reviewed and updated as appropriate: allergies, current medications, past family history, past medical history, past social history, past surgical history and problem list.   Objective:   Vitals:   11/12/21 0834  BP: (!) 103/56  Pulse: 84  Weight: 143 lb 1.6 oz (64.9 kg)    Fetal Status: Fetal Heart Rate (bpm): 148 Fundal Height: 27 cm Movement: Present     General:  Alert, oriented and cooperative. Patient is in no acute distress.  Skin: Skin is warm and dry. No rash noted.   Cardiovascular: Normal heart rate noted  Respiratory: Normal respiratory effort, no problems with respiration noted  Abdomen: Soft, gravid, appropriate for gestational age.  Pain/Pressure: Absent     Pelvic: Cervical exam deferred        Extremities: Normal range of motion.  Edema: None  Mental Status: Normal mood and affect. Normal behavior. Normal judgment and thought content.   Assessment and Plan:  Pregnancy: G3P2002 at [redacted]w[redacted]d 1. Encounter for supervision of low-risk first pregnancy in second trimester - Doing well, feeling regular and vigorous fetal movement  - Tdap vaccine greater than or equal to 7yo IM  2. [redacted] weeks gestation of pregnancy - Attempted GTT but vomited immediately after drinking the glucola. Gave options of trying again or doing home testing. Offered zofran for premedication which pt accepted. - Glucose Tolerance, 2 Hours w/1 Hour; Future  3. Language barrier Arabic  interpreter Ralene Ok) present for interpretation services  4. Nausea and vomiting during pregnancy - ondansetron (ZOFRAN-ODT) 8 MG disintegrating tablet; Take 1 tablet (8 mg total) by mouth every 8 (eight) hours as needed for nausea or vomiting.  Dispense: 5 tablet; Refill: 0  Preterm labor symptoms and general obstetric precautions including but not limited to vaginal bleeding, contractions, leaking of fluid and fetal movement were reviewed in detail with the patient. Please refer to After Visit Summary for other counseling recommendations.   Return in about 2 weeks (around 11/26/2021) for IN-PERSON, LOB.  Future Appointments  Date Time Provider Department Center  11/13/2021  8:50 AM [facility] LAB [facility]  12/03/2021  1:15 PM Levie Heritage, DO [facility]    Bernerd Limbo, CNM

## 2021-11-13 ENCOUNTER — Other Ambulatory Visit: Payer: Medicaid Other

## 2021-11-13 DIAGNOSIS — Z3A27 27 weeks gestation of pregnancy: Secondary | ICD-10-CM

## 2021-11-13 LAB — CBC
Hematocrit: 31.9 % — ABNORMAL LOW (ref 34.0–46.6)
Hemoglobin: 10.9 g/dL — ABNORMAL LOW (ref 11.1–15.9)
MCH: 31.2 pg (ref 26.6–33.0)
MCHC: 34.2 g/dL (ref 31.5–35.7)
MCV: 91 fL (ref 79–97)
Platelets: 237 10*3/uL (ref 150–450)
RBC: 3.49 x10E6/uL — ABNORMAL LOW (ref 3.77–5.28)
RDW: 12.2 % (ref 11.7–15.4)
WBC: 6.5 10*3/uL (ref 3.4–10.8)

## 2021-11-13 LAB — RPR: RPR Ser Ql: NONREACTIVE

## 2021-11-13 LAB — HIV ANTIBODY (ROUTINE TESTING W REFLEX): HIV Screen 4th Generation wRfx: NONREACTIVE

## 2021-11-14 LAB — GLUCOSE TOLERANCE, 2 HOURS W/ 1HR
Glucose, 1 hour: 137 mg/dL (ref 70–179)
Glucose, 2 hour: 125 mg/dL (ref 70–152)
Glucose, Fasting: 65 mg/dL — ABNORMAL LOW (ref 70–91)

## 2021-11-30 NOTE — L&D Delivery Note (Signed)
OB/GYN Faculty Practice Delivery Note ? ?Claudia Lucas is a 26 y.o. [id number] s/p VBAC at [redacted]w[redacted]d. She was admitted for IOL/TOLAC due to non-reassuring fetal heart tones at outpatient follow up.  ? ?ROM: 4h 17m with clear fluid ?GBS Status: Positive; PCN given ? ?Delivery Date/Time: 02/04/22, 2041 ? ?Delivery: Called to room and patient was complete and pushing. Head delivered LOA. No nuchal cord present. Shoulder and body delivered in usual fashion. Meconium present. Infant with spontaneous cry, placed on mother's abdomen, dried and stimulated. Cord clamped x 2 after 1-minute delay, and cut by MOB under my direct supervision. Cord blood drawn. Placenta delivered spontaneously with gentle cord traction. Fundus firm with massage and Pitocin. Labia, perineum, vagina, and cervix were inspected, found to have a minor first degree perineal laceration that was hemostatic and well approximated without need for repair.  ? ?Placenta: Intact, 3VC- Sent to L&D ?Complications: None ?Lacerations: First degree perineal ?EBL: 100 mL ?Analgesia: None ? ?Infant: Viable female  APGARs 6 and 8  weight pending ? ?Anselm Pancoast, DO PGY-1 ?02/04/2022, 8:59 PM ? ? ? ?

## 2021-12-03 ENCOUNTER — Encounter: Payer: Medicaid Other | Admitting: Family Medicine

## 2021-12-05 ENCOUNTER — Other Ambulatory Visit: Payer: Self-pay

## 2021-12-05 ENCOUNTER — Ambulatory Visit (INDEPENDENT_AMBULATORY_CARE_PROVIDER_SITE_OTHER): Payer: Medicaid Other | Admitting: Family

## 2021-12-05 DIAGNOSIS — Z3492 Encounter for supervision of normal pregnancy, unspecified, second trimester: Secondary | ICD-10-CM

## 2021-12-05 NOTE — Progress Notes (Signed)
° °  PRENATAL VISIT NOTE  Subjective:  Claudia Lucas is a 26 y.o. G3P2002 at [redacted]w[redacted]d being seen today for ongoing prenatal care.  Here with in person interpreter.  She is currently monitored for the following issues for this low-risk pregnancy and has History of cesarean section; Other allergic rhinitis; Supervision of low-risk pregnancy; and GBS bacteriuria on their problem list.  Patient reports no complaints.  Contractions: Not present. Vag. Bleeding: None.  Movement: Present. Denies leaking of fluid.   The following portions of the patient's history were reviewed and updated as appropriate: allergies, current medications, past family history, past medical history, past social history, past surgical history and problem list.   Objective:   Vitals:   12/05/21 1015  BP: 104/69  Pulse: 86  Weight: 150 lb (68 kg)    Fetal Status: Fetal Heart Rate (bpm): 143   Movement: Present     General:  Alert, oriented and cooperative. Patient is in no acute distress.  Skin: Skin is warm and dry. No rash noted.   Cardiovascular: Normal heart rate noted  Respiratory: Normal respiratory effort, no problems with respiration noted  Abdomen: Soft, gravid, appropriate for gestational age.  Pain/Pressure: Absent     Pelvic: Cervical exam deferred        Extremities: Normal range of motion.  Edema: None  Mental Status: Normal mood and affect. Normal behavior. Normal judgment and thought content.   Assessment and Plan:  Pregnancy: G3P2002 at [redacted]w[redacted]d 1. Encounter for supervision of low-risk pregnancy in second trimester - Reviewed remaining prenatal course  Preterm labor symptoms and general obstetric precautions including but not limited to vaginal bleeding, contractions, leaking of fluid and fetal movement were reviewed in detail with the patient. Please refer to After Visit Summary for other counseling recommendations.   Return in about 2 weeks (around 12/19/2021).  No future appointments.  Eino Farber Eloisa Northern, CNM

## 2021-12-18 NOTE — Progress Notes (Signed)
° °  PRENATAL VISIT NOTE  Subjective:  Claudia Lucas is a 26 y.o. G3P2002 at [redacted]w[redacted]d being seen today for ongoing prenatal care.  She is currently monitored for the following issues for this high-risk pregnancy and has History of cesarean section; Other allergic rhinitis; Supervision of low-risk pregnancy; and GBS bacteriuria on their problem list.  Patient reports no complaints except some pelvic pressure.  Contractions: Not present. Vag. Bleeding: None.  Movement: Present. Denies leaking of fluid.   The following portions of the patient's history were reviewed and updated as appropriate: allergies, current medications, past family history, past medical history, past social history, past surgical history and problem list.   Objective:   Vitals:   12/19/21 1022  BP: 99/74  Pulse: 97  Weight: 148 lb 6.4 oz (67.3 kg)    Fetal Status: Fetal Heart Rate (bpm): 136   Movement: Present     General:  Alert, oriented and cooperative. Patient is in no acute distress.  Skin: Skin is warm and dry. No rash noted.   Cardiovascular: Normal heart rate noted  Respiratory: Normal respiratory effort, no problems with respiration noted  Abdomen: Soft, gravid, appropriate for gestational age.  Pain/Pressure: Present     Pelvic: Cervical exam deferred        Extremities: Normal range of motion.  Edema: None  Mental Status: Normal mood and affect. Normal behavior. Normal judgment and thought content.   Assessment and Plan:  Pregnancy: G3P2002 at [redacted]w[redacted]d 1. History of cesarean section  - TOLAC consent reviewed - We discussed her history of c-section. Her previous c-section was due to   arrest of descent. .  She has a history of  successful vaginal delivery (VBAC) - We discussed the risks associated with repeat c-section: bleeding, infection, injury to surrounding organs/tissues I.e. bowel/bladder, development of scar tissue, wound complications such as wound separation or infection, need for additional  surgery, percreta/acreta - We discussed the risks associated with TOLAC: risk of it being unsuccessful, specially in the context of her history, the risks in general of a vaginal delivery (prolapse, SUI, differences in recovery, pelvic floor dysfunction, etc), and the risk of uterine rupture. We discussed with the risk of uterine rupture that while rare it is not easily predicted, that it is a surgical emergency, and it can be potentially catastrophic for mom and baby. We discussed if uterine rupture that it may necessitate hysterectomy if the rupture caused issues with bleeding that could not be managed with other surgical options.  - After counseling, the patient was given the opportunity to ask questions and all questions answered.  - After considering her options, she would like to [facility]. Consent reviewed and signed.  - Information provided to the patient   2. Encounter for supervision of low-risk pregnancy in third trimester Normal 28w labs  3. GBS bacteriuria PCN in labor  Preterm labor symptoms and general obstetric precautions including but not limited to vaginal bleeding, contractions, leaking of fluid and fetal movement were reviewed in detail with the patient. Please refer to After Visit Summary for other counseling recommendations.   Return in about 2 weeks (around 01/02/2022) for OB VISIT, MD or APP.  No future appointments.   Radene Gunning, MD

## 2021-12-19 ENCOUNTER — Ambulatory Visit (INDEPENDENT_AMBULATORY_CARE_PROVIDER_SITE_OTHER): Payer: Medicaid Other | Admitting: Obstetrics and Gynecology

## 2021-12-19 ENCOUNTER — Other Ambulatory Visit: Payer: Self-pay

## 2021-12-19 ENCOUNTER — Encounter: Payer: Self-pay | Admitting: Obstetrics and Gynecology

## 2021-12-19 VITALS — BP 99/74 | HR 97 | Wt 148.4 lb

## 2021-12-19 DIAGNOSIS — R8271 Bacteriuria: Secondary | ICD-10-CM

## 2021-12-19 DIAGNOSIS — Z98891 History of uterine scar from previous surgery: Secondary | ICD-10-CM

## 2021-12-19 DIAGNOSIS — Z3493 Encounter for supervision of normal pregnancy, unspecified, third trimester: Secondary | ICD-10-CM

## 2021-12-19 MED ORDER — COMFORT FIT MATERNITY SUPP MED MISC: 1.0000 | Freq: Once | 0 refills | Status: AC

## 2022-01-01 ENCOUNTER — Ambulatory Visit (INDEPENDENT_AMBULATORY_CARE_PROVIDER_SITE_OTHER): Payer: Medicaid Other

## 2022-01-01 ENCOUNTER — Other Ambulatory Visit: Payer: Self-pay

## 2022-01-01 VITALS — BP 114/73 | HR 89 | Wt 149.7 lb

## 2022-01-01 DIAGNOSIS — R8271 Bacteriuria: Secondary | ICD-10-CM

## 2022-01-01 DIAGNOSIS — Z3493 Encounter for supervision of normal pregnancy, unspecified, third trimester: Secondary | ICD-10-CM

## 2022-01-01 DIAGNOSIS — Z98891 History of uterine scar from previous surgery: Secondary | ICD-10-CM

## 2022-01-01 DIAGNOSIS — Z789 Other specified health status: Secondary | ICD-10-CM

## 2022-01-01 DIAGNOSIS — Z3A35 35 weeks gestation of pregnancy: Secondary | ICD-10-CM

## 2022-01-01 NOTE — Progress Notes (Signed)
° °  PRENATAL VISIT NOTE  Subjective:  Claudia Lucas is a 26 y.o. G3P2002 at [redacted]w[redacted]d being seen today for ongoing prenatal care.  She is currently monitored for the following issues for this high-risk pregnancy and has History of cesarean section; Other allergic rhinitis; Supervision of low-risk pregnancy; and GBS bacteriuria on their problem list.  Patient reports no complaints.  Contractions: Not present. Vag. Bleeding: None.  Movement: Present. Denies leaking of fluid.   The following portions of the patient's history were reviewed and updated as appropriate: allergies, current medications, past family history, past medical history, past social history, past surgical history and problem list.   Objective:   Vitals:   01/01/22 0924  BP: 114/73  Pulse: 89  Weight: 149 lb 11.2 oz (67.9 kg)    Fetal Status: Fetal Heart Rate (bpm): 149 Fundal Height: 35 cm Movement: Present     General:  Alert, oriented and cooperative. Patient is in no acute distress.  Skin: Skin is warm and dry. No rash noted.   Cardiovascular: Normal heart rate noted  Respiratory: Normal respiratory effort, no problems with respiration noted  Abdomen: Soft, gravid, appropriate for gestational age.  Pain/Pressure: Absent     Pelvic: Cervical exam deferred        Extremities: Normal range of motion.  Edema: None  Mental Status: Normal mood and affect. Normal behavior. Normal judgment and thought content.   Assessment and Plan:  Pregnancy: G3P2002 at [redacted]w[redacted]d 1. Encounter for supervision of low-risk pregnancy in third trimester - Routine OB. Doing well. No concerns. - GC/CT next visit - Reviewed location of [facility] and MAU - Anticipatory guidance for upcoming appointments provided  2. History of cesarean section - Signed TOLAC consent on 1/20  3. [redacted] weeks gestation of pregnancy   4. GBS bacteriuria - PCN in labor  5. Language barrier - Live interpretor used for today's visit   Preterm labor symptoms and general  obstetric precautions including but not limited to vaginal bleeding, contractions, leaking of fluid and fetal movement were reviewed in detail with the patient. Please refer to After Visit Summary for other counseling recommendations.   Return in about 1 week (around 01/08/2022).  Future Appointments  Date Time Provider [place]  01/08/2022  3:55 PM Brown [facility]  01/14/2022 10:15 AM Starr Lake, CNM [facility]  01/22/2022 10:35 AM Renee Harder, CNM [facility] [place], CNM

## 2022-01-08 ENCOUNTER — Other Ambulatory Visit (HOSPITAL_COMMUNITY)
Admission: RE | Admit: 2022-01-08 | Discharge: 2022-01-08 | Disposition: A | Discharge status: Home / Self Care | Payer: Medicaid Other | Source: Ambulatory Visit | Attending: Student | Admitting: Student

## 2022-01-08 ENCOUNTER — Ambulatory Visit (INDEPENDENT_AMBULATORY_CARE_PROVIDER_SITE_OTHER): Payer: Medicaid Other | Admitting: Student

## 2022-01-08 ENCOUNTER — Other Ambulatory Visit: Payer: Self-pay

## 2022-01-08 VITALS — BP 103/59 | HR 91 | Wt 149.0 lb

## 2022-01-08 DIAGNOSIS — Z3A36 36 weeks gestation of pregnancy: Secondary | ICD-10-CM

## 2022-01-08 DIAGNOSIS — Z3493 Encounter for supervision of normal pregnancy, unspecified, third trimester: Secondary | ICD-10-CM

## 2022-01-08 MED ORDER — TERCONAZOLE 0.4 % VA CREA: 1.0000 | TOPICAL_CREAM | Freq: Every day | VAGINAL | 0 refills | Status: DC

## 2022-01-08 NOTE — Progress Notes (Signed)
° °  PRENATAL VISIT NOTE  Subjective:  Claudia Lucas is a 26 y.o. G3P2002 at [redacted]w[redacted]d being seen today for ongoing prenatal care.  She is currently monitored for the following issues for this low-risk pregnancy and has History of cesarean section; Other allergic rhinitis; Supervision of low-risk pregnancy; and GBS bacteriuria on their problem list.  Patient reports  vaginal itching and discomfort .  Contractions: Not present. Vag. Bleeding: None.  Movement: Present. Denies leaking of fluid.   The following portions of the patient's history were reviewed and updated as appropriate: allergies, current medications, past family history, past medical history, past social history, past surgical history and problem list.   Objective:   Vitals:   01/08/22 1603  BP: (!) 103/59  Pulse: 91  Weight: 149 lb (67.6 kg)    Fetal Status: Fetal Heart Rate (bpm): 153 Fundal Height: 36 cm Movement: Present  Presentation: Vertex  General:  Alert, oriented and cooperative. Patient is in no acute distress.  Skin: Skin is warm and dry. No rash noted.   Cardiovascular: Normal heart rate noted  Respiratory: Normal respiratory effort, no problems with respiration noted  Abdomen: Soft, gravid, appropriate for gestational age.  Pain/Pressure: Present     Pelvic: Cervical exam performed in the presence of a chaperone Dilation: Closed      Extremities: Normal range of motion.  Edema: None  Mental Status: Normal mood and affect. Normal behavior. Normal judgment and thought content.   Assessment and Plan:  Pregnancy: G3P2002 at 106w0d 1. Encounter for supervision of low-risk pregnancy in third trimester -patient is vertex by [place] -given report of itching and clumpy white discharge on glove, will treat with terazole - GC/Chlamydia probe amp ([place])not at [facility] -confirmed postplacental IUD -confirmed pediatrican Term labor symptoms and general obstetric precautions including but not limited to vaginal bleeding,  contractions, leaking of fluid and fetal movement were reviewed in detail with the patient. Please refer to After Visit Summary for other counseling recommendations.   No follow-ups on file.  Future Appointments  Date Time Provider Department Center  01/14/2022 10:15 AM [name], CNM [facility]  01/22/2022 10:35 AM Brand Males, CNM [facility]    [name], CNM

## 2022-01-08 NOTE — Patient Instructions (Signed)
-  come to hospital if any bleeding, waterbroke, or any concerns for baby movements

## 2022-01-09 LAB — GC/CHLAMYDIA PROBE AMP (~~LOC~~) NOT AT ARMC
Chlamydia: NEGATIVE
Comment: NEGATIVE
Comment: NORMAL
Neisseria Gonorrhea: NEGATIVE

## 2022-01-14 ENCOUNTER — Other Ambulatory Visit: Payer: Self-pay

## 2022-01-14 ENCOUNTER — Ambulatory Visit (INDEPENDENT_AMBULATORY_CARE_PROVIDER_SITE_OTHER): Payer: Medicaid Other | Admitting: Student

## 2022-01-14 VITALS — BP 105/69 | HR 94 | Wt 150.0 lb

## 2022-01-14 DIAGNOSIS — Z3A36 36 weeks gestation of pregnancy: Secondary | ICD-10-CM

## 2022-01-14 DIAGNOSIS — Z3493 Encounter for supervision of normal pregnancy, unspecified, third trimester: Secondary | ICD-10-CM

## 2022-01-14 MED ORDER — TERCONAZOLE 0.4 % VA CREA: 1.0000 | TOPICAL_CREAM | Freq: Every day | VAGINAL | 0 refills | Status: DC

## 2022-01-14 NOTE — Progress Notes (Signed)
Patient reports daily contractions that last about 1-2 minutes and then go away

## 2022-01-14 NOTE — Progress Notes (Signed)
° °  PRENATAL VISIT NOTE  Subjective:  Claudia Lucas is a 26 y.o. G3P2002 at [redacted]w[redacted]d being seen today for ongoing prenatal care.  She is currently monitored for the following issues for this low-risk pregnancy and has History of cesarean section; Other allergic rhinitis; Supervision of low-risk pregnancy; and GBS bacteriuria on their problem list.  Patient reports no complaints.  Contractions: Irritability.  .  Movement: Present. Denies leaking of fluid.   The following portions of the patient's history were reviewed and updated as appropriate: allergies, current medications, past family history, past medical history, past social history, past surgical history and problem list.   Objective:   Vitals:   01/14/22 1020  BP: 105/69  Pulse: 94  Weight: 150 lb (68 kg)    Fetal Status: Fetal Heart Rate (bpm): 143 Fundal Height: 36 cm Movement: Present     General:  Alert, oriented and cooperative. Patient is in no acute distress.  Skin: Skin is warm and dry. No rash noted.   Cardiovascular: Normal heart rate noted  Respiratory: Normal respiratory effort, no problems with respiration noted  Abdomen: Soft, gravid, appropriate for gestational age.  Pain/Pressure: Present     Pelvic: Cervical exam deferred        Extremities: Normal range of motion.     Mental Status: Normal mood and affect. Normal behavior. Normal judgment and thought content.   Assessment and Plan:  Pregnancy: G3P2002 at [redacted]w[redacted]d 1. [redacted] weeks gestation of pregnancy   2. Encounter for supervision of low-risk pregnancy in third trimester    -doing yeast treatment; feels it is working. She is requesting a refill because she feels like it is not entirely working. The patient reports not filling the applicator completely; suggested that she start using full amount  and refill given -all questions answered   Preterm labor symptoms and general obstetric precautions including but not limited to vaginal bleeding, contractions, leaking  of fluid and fetal movement were reviewed in detail with the patient. Please refer to After Visit Summary for other counseling recommendations.   No follow-ups on file.  Future Appointments  Date Time Provider Department Center  01/22/2022 10:35 AM Brand Males, CNM [facility]    [name], [place]

## 2022-01-22 ENCOUNTER — Ambulatory Visit (INDEPENDENT_AMBULATORY_CARE_PROVIDER_SITE_OTHER): Payer: Medicaid Other

## 2022-01-22 ENCOUNTER — Other Ambulatory Visit: Payer: Self-pay

## 2022-01-22 VITALS — BP 106/77 | HR 86 | Wt 155.0 lb

## 2022-01-22 DIAGNOSIS — Z3A38 38 weeks gestation of pregnancy: Secondary | ICD-10-CM

## 2022-01-22 DIAGNOSIS — Z98891 History of uterine scar from previous surgery: Secondary | ICD-10-CM

## 2022-01-22 DIAGNOSIS — R8271 Bacteriuria: Secondary | ICD-10-CM

## 2022-01-22 DIAGNOSIS — Z3493 Encounter for supervision of normal pregnancy, unspecified, third trimester: Secondary | ICD-10-CM

## 2022-01-22 DIAGNOSIS — Z789 Other specified health status: Secondary | ICD-10-CM

## 2022-01-22 NOTE — Progress Notes (Signed)
° °  PRENATAL VISIT NOTE  Subjective:  Claudia Lucas is a 26 y.o. G3P2002 at [redacted]w[redacted]d being seen today for ongoing prenatal care.  She is currently monitored for the following issues for this low-risk pregnancy and has History of cesarean section; Other allergic rhinitis; Supervision of low-risk pregnancy; and GBS bacteriuria on their problem list.  Patient reports no complaints.  Contractions: Irritability.  .  Movement: Present. Denies leaking of fluid.   The following portions of the patient's history were reviewed and updated as appropriate: allergies, current medications, past family history, past medical history, past social history, past surgical history and problem list.   Objective:   Vitals:   01/22/22 1036  BP: 106/77  Pulse: 86  Weight: 155 lb (70.3 kg)    Fetal Status: Fetal Heart Rate (bpm): 135 Fundal Height: 38 cm Movement: Present     General:  Alert, oriented and cooperative. Patient is in no acute distress.  Skin: Skin is warm and dry. No rash noted.   Cardiovascular: Normal heart rate noted  Respiratory: Normal respiratory effort, no problems with respiration noted  Abdomen: Soft, gravid, appropriate for gestational age.  Pain/Pressure: Absent     Pelvic: Cervical exam deferred        Extremities: Normal range of motion.     Mental Status: Normal mood and affect. Normal behavior. Normal judgment and thought content.   Assessment and Plan:  Pregnancy: G3P2002 at [redacted]w[redacted]d 1. Encounter for supervision of low-risk pregnancy in third trimester - Routine OB. Doing well. No concerns. Occasional braxton hicks and pelvic pressure in the evenings.  - Anticipatory guidance for upcoming appointments provided - Reviewed location of [facility] and MAU  2. [redacted] weeks gestation of pregnancy - Endorses active fetal movement  3. History of cesarean section - Desires TOLAC  4. GBS bacteriuria - Treat in labor  5. Language barrier - Live Arabic interpretor used for today's  visit   Term labor symptoms and general obstetric precautions including but not limited to vaginal bleeding, contractions, leaking of fluid and fetal movement were reviewed in detail with the patient. Please refer to After Visit Summary for other counseling recommendations.   Return in about 1 week (around 01/29/2022).  Future Appointments  Date Time Provider Department Center  01/29/2022 10:55 AM Calvert Cantor, CNM [facility]    Brand Males, CNM

## 2022-01-29 ENCOUNTER — Ambulatory Visit (INDEPENDENT_AMBULATORY_CARE_PROVIDER_SITE_OTHER): Payer: Medicaid Other | Admitting: Advanced Practice Midwife

## 2022-01-29 ENCOUNTER — Other Ambulatory Visit: Payer: Self-pay

## 2022-01-29 VITALS — BP 106/78 | HR 93 | Wt 151.0 lb

## 2022-01-29 DIAGNOSIS — R8271 Bacteriuria: Secondary | ICD-10-CM

## 2022-01-29 DIAGNOSIS — Z98891 History of uterine scar from previous surgery: Secondary | ICD-10-CM

## 2022-01-29 DIAGNOSIS — Z3A39 39 weeks gestation of pregnancy: Secondary | ICD-10-CM

## 2022-01-29 DIAGNOSIS — Z3493 Encounter for supervision of normal pregnancy, unspecified, third trimester: Secondary | ICD-10-CM

## 2022-01-29 NOTE — Progress Notes (Signed)
? ?  PRENATAL VISIT NOTE ? ?Subjective:  ?Claudia Lucas is a 26 y.o. G3P2002 at [redacted]w[redacted]d being seen today for ongoing prenatal care.  She is currently monitored for the following issues for this low-risk pregnancy and has History of cesarean section; Other allergic rhinitis; Supervision of low-risk pregnancy; and GBS bacteriuria on their problem list. ? ?Patient reports no complaints.  Contractions: Irritability. Vag. Bleeding: None.  Movement: Present. Denies leaking of fluid.  ? ?Patient is amenable to IOL being scheduled during today's visit. She requests IOL at 41 weeks if possible. ? ?The following portions of the patient's history were reviewed and updated as appropriate: allergies, current medications, past family history, past medical history, past social history, past surgical history and problem list. Problem list updated. ? ?Objective:  ? ?Vitals:  ? 01/29/22 1054  ?BP: 106/78  ?Pulse: 93  ?Weight: 151 lb (68.5 kg)  ? ? ?Fetal Status: Fetal Heart Rate (bpm): 135   Movement: Present  Presentation: Vertex ? ?General:  Alert, oriented and cooperative. Patient is in no acute distress.  ?Skin: Skin is warm and dry. No rash noted.   ?Cardiovascular: Normal heart rate noted  ?Respiratory: Normal respiratory effort, no problems with respiration noted  ?Abdomen: Soft, gravid, appropriate for gestational age.  Pain/Pressure: Present     ?Pelvic: Cervical exam deferred      Declined by patient  ?Extremities: Normal range of motion.  Edema: None  ?Mental Status: Normal mood and affect. Normal behavior. Normal judgment and thought content.  ? ?Assessment and Plan:  ?Pregnancy: G3P2002 at [redacted]w[redacted]d ? ?1. Encounter for supervision of low-risk pregnancy in third trimester ?- Routine care ?- Declined cervical exam, declined membrane sweep today ?- Hx VBAC x 1 ?- Per request IOL scheduled at 41 weeks on 03/16.  ?- Orders placed for low dose Pitocin + Foley bulb (TOLAC) ? ?2. GBS bacteriuria ?- PCN in labor ? ?3. History of  cesarean section ?- Successful VBAC x 1 ([id number]) ?- TOLAC consent signed with Dr. Para March 12/19/2021 ? ?4. [redacted] weeks gestation of pregnancy ? ? ?Term labor symptoms and general obstetric precautions including but not limited to vaginal bleeding, contractions, leaking of fluid and fetal movement were reviewed in detail with the patient. ?Please refer to After Visit Summary for other counseling recommendations.  ?Return in about 1 week (around 02/05/2022) for 40 week ROB with BPP. ? ?Future Appointments  ?Date Time Provider Department Center  ?02/04/2022  9:15 AM Bernerd Limbo, CNM [facility]  ?02/04/2022 10:15 AM [facility] NST [facility]  ?02/12/2022  7:45 AM [facility] SCHED ROOM [facility] None  ? ? ?Calvert Cantor, CNM ? ?

## 2022-02-04 ENCOUNTER — Encounter (HOSPITAL_COMMUNITY): Payer: Self-pay | Admitting: Family Medicine

## 2022-02-04 ENCOUNTER — Other Ambulatory Visit: Payer: Self-pay

## 2022-02-04 ENCOUNTER — Ambulatory Visit (INDEPENDENT_AMBULATORY_CARE_PROVIDER_SITE_OTHER): Payer: Medicaid Other | Admitting: Certified Nurse Midwife

## 2022-02-04 ENCOUNTER — Inpatient Hospital Stay (HOSPITAL_COMMUNITY)
Admission: AD | Admit: 2022-02-04 | Discharge: 2022-02-06 | DRG: 807 | Disposition: A | Discharge status: Home / Self Care | Payer: Medicaid Other | Attending: Obstetrics & Gynecology | Admitting: Obstetrics & Gynecology

## 2022-02-04 ENCOUNTER — Ambulatory Visit: Payer: Medicaid Other | Admitting: *Deleted

## 2022-02-04 ENCOUNTER — Ambulatory Visit (INDEPENDENT_AMBULATORY_CARE_PROVIDER_SITE_OTHER): Payer: Medicaid Other

## 2022-02-04 VITALS — BP 112/73 | HR 83 | Wt 151.8 lb

## 2022-02-04 DIAGNOSIS — Z98891 History of uterine scar from previous surgery: Secondary | ICD-10-CM

## 2022-02-04 DIAGNOSIS — R8271 Bacteriuria: Secondary | ICD-10-CM

## 2022-02-04 DIAGNOSIS — Z3A40 40 weeks gestation of pregnancy: Secondary | ICD-10-CM | POA: Diagnosis not present

## 2022-02-04 DIAGNOSIS — O368130 Decreased fetal movements, third trimester, not applicable or unspecified: Secondary | ICD-10-CM

## 2022-02-04 DIAGNOSIS — O480 Post-term pregnancy: Secondary | ICD-10-CM

## 2022-02-04 DIAGNOSIS — Z3490 Encounter for supervision of normal pregnancy, unspecified, unspecified trimester: Secondary | ICD-10-CM

## 2022-02-04 DIAGNOSIS — Z3A39 39 weeks gestation of pregnancy: Secondary | ICD-10-CM

## 2022-02-04 DIAGNOSIS — Z20822 Contact with and (suspected) exposure to covid-19: Secondary | ICD-10-CM | POA: Diagnosis present

## 2022-02-04 DIAGNOSIS — O34211 Maternal care for low transverse scar from previous cesarean delivery: Secondary | ICD-10-CM | POA: Diagnosis not present

## 2022-02-04 DIAGNOSIS — Z30017 Encounter for initial prescription of implantable subdermal contraceptive: Secondary | ICD-10-CM

## 2022-02-04 DIAGNOSIS — O34219 Maternal care for unspecified type scar from previous cesarean delivery: Secondary | ICD-10-CM | POA: Diagnosis present

## 2022-02-04 DIAGNOSIS — O99824 Streptococcus B carrier state complicating childbirth: Secondary | ICD-10-CM | POA: Diagnosis present

## 2022-02-04 DIAGNOSIS — Z3A41 41 weeks gestation of pregnancy: Principal | ICD-10-CM | POA: Diagnosis present

## 2022-02-04 DIAGNOSIS — Z3493 Encounter for supervision of normal pregnancy, unspecified, third trimester: Secondary | ICD-10-CM

## 2022-02-04 DIAGNOSIS — O99820 Streptococcus B carrier state complicating pregnancy: Secondary | ICD-10-CM | POA: Diagnosis not present

## 2022-02-04 LAB — CBC
HCT: 32.7 % — ABNORMAL LOW (ref 36.0–46.0)
Hemoglobin: 11 g/dL — ABNORMAL LOW (ref 12.0–15.0)
MCH: 31.2 pg (ref 26.0–34.0)
MCHC: 33.6 g/dL (ref 30.0–36.0)
MCV: 92.6 fL (ref 80.0–100.0)
Platelets: 231 10*3/uL (ref 150–400)
RBC: 3.53 MIL/uL — ABNORMAL LOW (ref 3.87–5.11)
RDW: 13.4 % (ref 11.5–15.5)
WBC: 6.9 10*3/uL (ref 4.0–10.5)
nRBC: 0 % (ref 0.0–0.2)

## 2022-02-04 LAB — TYPE AND SCREEN
ABO/RH(D): B POS
Antibody Screen: NEGATIVE

## 2022-02-04 LAB — RESP PANEL BY RT-PCR (FLU A&B, COVID) ARPGX2
Influenza A by PCR: NEGATIVE
Influenza B by PCR: NEGATIVE
SARS Coronavirus 2 by RT PCR: NEGATIVE

## 2022-02-04 MED ORDER — COCONUT OIL OIL: 1.0000 "application " | TOPICAL_OIL | Status: DC | PRN

## 2022-02-04 MED ORDER — LIDOCAINE HCL (PF) 1 % IJ SOLN: 30.0000 mL | INTRAMUSCULAR | Status: DC | PRN

## 2022-02-04 MED ORDER — ACETAMINOPHEN 325 MG PO TABS: 650.0000 mg | ORAL_TABLET | ORAL | Status: DC | PRN

## 2022-02-04 MED ORDER — FENTANYL CITRATE (PF) 100 MCG/2ML IJ SOLN
100.0000 ug | INTRAMUSCULAR | Status: DC | PRN
  Administered 2022-02-04: 100 ug via INTRAVENOUS
  Filled 2022-02-04: qty 2

## 2022-02-04 MED ORDER — SOD CITRATE-CITRIC ACID 500-334 MG/5ML PO SOLN: 30.0000 mL | ORAL | Status: DC | PRN

## 2022-02-04 MED ORDER — OXYTOCIN BOLUS FROM INFUSION
333.0000 mL | Freq: Once | INTRAVENOUS | Status: AC
Start: 2022-02-04 — End: 2022-02-04
  Administered 2022-02-04: 333 mL via INTRAVENOUS

## 2022-02-04 MED ORDER — LACTATED RINGERS IV SOLN: 500.0000 mL | INTRAVENOUS | Status: DC | PRN

## 2022-02-04 MED ORDER — OXYTOCIN-SODIUM CHLORIDE 30-0.9 UT/500ML-% IV SOLN
1.0000 m[IU]/min | INTRAVENOUS | Status: DC
  Filled 2022-02-04: qty 500

## 2022-02-04 MED ORDER — SIMETHICONE 80 MG PO CHEW: 80.0000 mg | CHEWABLE_TABLET | ORAL | Status: DC | PRN

## 2022-02-04 MED ORDER — BENZOCAINE-MENTHOL 20-0.5 % EX AERO
1.0000 "application " | INHALATION_SPRAY | CUTANEOUS | Status: DC | PRN
  Administered 2022-02-04: 1 via TOPICAL
  Filled 2022-02-04: qty 56

## 2022-02-04 MED ORDER — OXYTOCIN-SODIUM CHLORIDE 30-0.9 UT/500ML-% IV SOLN
1.0000 m[IU]/min | INTRAVENOUS | Status: DC
  Administered 2022-02-04: 1 m[IU]/min via INTRAVENOUS

## 2022-02-04 MED ORDER — ONDANSETRON HCL 4 MG PO TABS: 4.0000 mg | ORAL_TABLET | ORAL | Status: DC | PRN

## 2022-02-04 MED ORDER — OXYTOCIN-SODIUM CHLORIDE 30-0.9 UT/500ML-% IV SOLN
2.5000 [IU]/h | INTRAVENOUS | Status: DC
Start: 2022-02-04 — End: 2022-02-04

## 2022-02-04 MED ORDER — WITCH HAZEL-GLYCERIN EX PADS: 1.0000 "application " | MEDICATED_PAD | CUTANEOUS | Status: DC | PRN

## 2022-02-04 MED ORDER — ONDANSETRON HCL 4 MG/2ML IJ SOLN: 4.0000 mg | INTRAMUSCULAR | Status: DC | PRN

## 2022-02-04 MED ORDER — ONDANSETRON HCL 4 MG/2ML IJ SOLN: 4.0000 mg | Freq: Four times a day (QID) | INTRAMUSCULAR | Status: DC | PRN

## 2022-02-04 MED ORDER — PRENATAL MULTIVITAMIN CH
1.0000 | ORAL_TABLET | Freq: Every day | ORAL | Status: DC
  Administered 2022-02-05 – 2022-02-06 (×2): 1 via ORAL
  Filled 2022-02-04 (×2): qty 1

## 2022-02-04 MED ORDER — SODIUM CHLORIDE 0.9 % IV SOLN
5.0000 10*6.[IU] | Freq: Once | INTRAVENOUS | Status: AC
  Administered 2022-02-04: 5 10*6.[IU] via INTRAVENOUS
  Filled 2022-02-04: qty 5

## 2022-02-04 MED ORDER — TERBUTALINE SULFATE 1 MG/ML IJ SOLN: 0.2500 mg | Freq: Once | INTRAMUSCULAR | Status: DC | PRN

## 2022-02-04 MED ORDER — DIBUCAINE (PERIANAL) 1 % EX OINT: 1.0000 "application " | TOPICAL_OINTMENT | CUTANEOUS | Status: DC | PRN

## 2022-02-04 MED ORDER — SENNOSIDES-DOCUSATE SODIUM 8.6-50 MG PO TABS
2.0000 | ORAL_TABLET | Freq: Every day | ORAL | Status: DC
  Administered 2022-02-05 – 2022-02-06 (×2): 2 via ORAL
  Filled 2022-02-04 (×2): qty 2

## 2022-02-04 MED ORDER — LACTATED RINGERS IV SOLN
INTRAVENOUS | Status: DC
  Administered 2022-02-04: 950 mL via INTRAVENOUS

## 2022-02-04 MED ORDER — PENICILLIN G POT IN DEXTROSE 60000 UNIT/ML IV SOLN
3.0000 10*6.[IU] | INTRAVENOUS | Status: DC
  Administered 2022-02-04: 3 10*6.[IU] via INTRAVENOUS
  Filled 2022-02-04: qty 50

## 2022-02-04 MED ORDER — DIPHENHYDRAMINE HCL 25 MG PO CAPS: 25.0000 mg | ORAL_CAPSULE | Freq: Four times a day (QID) | ORAL | Status: DC | PRN

## 2022-02-04 MED ORDER — IBUPROFEN 600 MG PO TABS
600.0000 mg | ORAL_TABLET | Freq: Four times a day (QID) | ORAL | Status: DC
  Administered 2022-02-04 – 2022-02-06 (×7): 600 mg via ORAL
  Filled 2022-02-04 (×7): qty 1

## 2022-02-04 NOTE — Progress Notes (Signed)
Pt reports decreased fetal movement x1-2 weeks.  NST/BPP today ?

## 2022-02-04 NOTE — H&P (Addendum)
OBSTETRIC ADMISSION HISTORY AND PHYSICAL ? ?Health visitor Used During [employer] ? ?Claudia Lucas is a 26 y.o. female G3P2002 with IUP at [redacted]w[redacted]d by LMP presenting for IOL/TOLAC d/t nonreassuring fetal heart tones in clinic today with an almost 2 minute variable/late deceleration. She reports +FMs although somewhat less this morning, No LOF, no VB, no blurry vision, headaches or peripheral edema, and RUQ pain.  She plans on breast and bottle feeding. She requests nexplanon for birth control. ?She received her prenatal care at  [facility]   ? ?Dating: By LMP --->  Estimated Date of Delivery: 02/05/22 ? ?Sono:   ?@[redacted]w[redacted]d , CWD, normal anatomy, breech presentation,  617 g, 87% EFW ?Cephalic on exam at admission ? ?Prenatal History/Complications:  ?--Hx of prior C/S d/t arrest of descent ?--Hx of successful term VBAC ?--GBS Bacteriuria ?--Arabic Speaking ? ?Past Medical History: ?Past Medical History:  ?Diagnosis Date  ? Anemia   ? Ovarian cyst   ? ? ?Past Surgical History: ?Past Surgical History:  ?Procedure Laterality Date  ? CESAREAN SECTION N/A 12/29/2015  ? Procedure: CESAREAN SECTION;  Surgeon: 12/31/2015, MD;  Location: [facility];  Service: Obstetrics;  Laterality: N/A;  ? ? ?Obstetrical History: ?OB History   ? ? Gravida  ?3  ? Para  ?2  ? Term  ?2  ? Preterm  ?   ? AB  ?   ? Living  ?2  ?  ? ? SAB  ?   ? IAB  ?   ? Ectopic  ?   ? Multiple  ?0  ? Live Births  ?2  ?   ?  ?  ? ? ?Social History ?Social History  ? ?Socioeconomic History  ? Marital status: Married  ?  Spouse name: Not on file  ? Number of children: Not on file  ? Years of education: Not on file  ? Highest education level: Not on file  ?Occupational History  ? Not on file  ?Tobacco Use  ? Smoking status: Never  ? Smokeless tobacco: Never  ?Substance and Sexual Activity  ? Alcohol use: No  ? Drug use: No  ? Sexual activity: Yes  ?  Birth control/protection: Implant  ?Other Topics Concern  ? Not on file  ?Social History Narrative  ? Not on file   ? ?Social Determinants of Health  ? ?Financial Resource Strain: Not on file  ?Food Insecurity: No Food Insecurity  ? Worried About Lesly Dukes in the Last Year: Never true  ? Ran Out of Food in the Last Year: Never true  ?Transportation Needs: No Transportation Needs  ? Lack of Transportation (Medical): No  ? Lack of Transportation (Non-Medical): No  ?Physical Activity: Not on file  ?Stress: Not on file  ?Social Connections: Not on file  ? ? ?Family History: ?Family History  ?Problem Relation Age of Onset  ? Diabetes Mother   ? Hypertension Mother   ? Hypertension Maternal Grandmother   ? Diabetes Maternal Grandmother   ? Hypertension Maternal Grandfather   ? Diabetes Maternal Grandfather   ? Hypertension Maternal Aunt   ? Diabetes Maternal Aunt   ? Hypertension Maternal Uncle   ? Diabetes Maternal Uncle   ? Cancer Neg Hx   ? Heart disease Neg Hx   ? Stroke Neg Hx   ? ? ?Allergies: ?No Known Allergies ? ?Pt denies allergies to latex, iodine, or shellfish. ? ?Medications Prior to Admission  ?Medication Sig Dispense Refill Last Dose  ? Prenatal  Vit-Fe Fumarate-FA (PREPLUS) 27-1 MG TABS Take 1 each by mouth daily. 30 tablet 12 Past Week  ? terconazole (TERAZOL 7) 0.4 % vaginal cream Place 1 applicator vaginally at bedtime. 45 g 0 Past Week  ? ? ? ?Review of Systems  ? ?All systems reviewed and negative except as stated in HPI ? ?Blood pressure 116/83, pulse 78, temperature 98.5 ?F (36.9 ?C), temperature source Oral, resp. rate 16, height 5\' 3"  (1.6 m), weight 69.1 kg, last menstrual period 05/01/2021, currently breastfeeding. ?General appearance: alert, cooperative, and mild distress ?Lungs: no iWOB ?Heart: regular rate  ?Abdomen: soft, non-tender ?Extremities: no sign of DVT ?Presentation: cephalic ?Fetal monitoringBaseline: 140s bpm, Variability: Good {> 6 bpm), Accelerations: Reactive, and Decelerations: Absent ?Uterine activityOccasional ?Dilation: 2 ?Effacement (%): 50 ?Station: -2 ?Exam by:: Dr.  Ephriam Jenkinsas ? ? ?Prenatal labs: ?ABO, Rh: --/--/B POS (03/08 1430) ?Antibody: NEG (03/08 1430) ?Rubella: 11.70 (08/25 1523) ?RPR: Non Reactive (12/14 0857)  ?HBsAg: Negative (08/25 1523)  ?HIV: Non Reactive (12/14 0857)  ?GBS: Positive/-- (06/24 0000)  ?2 hr Glucola: Nml (125) ?Genetic screening:  Panorama LR female, Horizon negative ?Anatomy US: Normal  ? ?Prenatal Transfer Tool  ?Maternal Diabetes: No ?Genetic Screening: Normal ?Maternal Ultrasounds/Referrals: Normal ?Fetal Ultrasounds or other Referrals:  BPP today 10/10 however late decel seen during NST ?Maternal Substance Abuse:  No ?Significant Maternal Medications:  None ?Significant Maternal Lab Results: Group B Strep positive ? ?Results for orders placed or performed during the hospital encounter of 02/04/22 (from the past 24 hour(s))  ?CBC  ? Collection Time: 02/04/22  2:30 PM  ?Result Value Ref Range  ? WBC 6.9 4.0 - 10.5 K/uL  ? RBC 3.53 (L) 3.87 - 5.11 MIL/uL  ? Hemoglobin 11.0 (L) 12.0 - 15.0 g/dL  ? HCT 32.7 (L) 36.0 - 46.0 %  ? MCV 92.6 80.0 - 100.0 fL  ? MCH 31.2 26.0 - 34.0 pg  ? MCHC 33.6 30.0 - 36.0 g/dL  ? RDW 13.4 11.5 - 15.5 %  ? Platelets 231 150 - 400 K/uL  ? nRBC 0.0 0.0 - 0.2 %  ?Type and screen  ? Collection Time: 02/04/22  2:30 PM  ?Result Value Ref Range  ? ABO/RH(D) B POS   ? Antibody Screen NEG   ? Sample Expiration    ?  02/07/2022,2359 ?Performed at [facility], [address] [place], [place] [id number] ?  ? ? ?Patient Active Problem List  ? Diagnosis Date Noted  ? Post term pregnancy at [redacted] weeks gestation 02/04/2022  ? GBS bacteriuria 07/29/2021  ? Supervision of low-risk pregnancy 07/24/2021  ? Other allergic rhinitis 12/19/2018  ? History of cesarean section 12/31/2015  ? ? ?Assessment/Plan:  ?Claudia Lucas is a 26 y.o. G3P2002 at 2361w6d here for IOL/TOLAC d/t nonreassuring fetal heart tones in clinic today with an almost 2 minute variable/late deceleration. ? ?#Labor: FB placed in room. AROM occurred with placement of FB with  clear fluid. Will monitor and consider low dose pitocin for contractions once gets first dose of PCN. ?#Pain: Plan for epidural ?#FWB: Cat I ?#ID:  GBS + > PCN ?#MOF: Bottle and breast ?#MOC: Nexplanon ?#Circ:  Yes ? ?#TOLAC ?S/p 1 successful VBAC. Discussed risks/benefits of TOLAC in depth with patient including risk of uterine rupture. Patient expressed understanding and would like to proceed. TOLAC consent signed in office ? ?Levin ErpMayuri Jagadish, MD  ?Center for [facility], [facility] ?02/04/2022, 4:09 PM ? ?  ? ?GME ATTESTATION:  ?I  saw and evaluated the patient. I agree with the findings and the plan of care as documented in the resident?s note and have made all necessary edits. ? ?Warner Mccreedy, MD, MPH ?OB Fellow, Faculty Practice ?[place], Center for [facility] ?02/04/2022 4:50 PM ? ?

## 2022-02-04 NOTE — Patient Instructions (Addendum)
[facility] ?[address] ?[place], [place] [id number] ? ?

## 2022-02-04 NOTE — Progress Notes (Signed)
? ?  PRENATAL VISIT NOTE ? ?Subjective:  ?Claudia Lucas is a 26 y.o. G3P2002 at 100w6d being seen today for ongoing prenatal care.  She is currently monitored for the following issues for this low-risk pregnancy and has History of cesarean section; Other allergic rhinitis; Supervision of low-risk pregnancy; and GBS bacteriuria on their problem list. ? ?Patient reports occasional contractions and has noticed the baby is not making as vigorous of movements has it has prior .  Contractions: Irritability. Vag. Bleeding: None.  Movement: (!) Decreased. Denies leaking of fluid.  ? ?The following portions of the patient's history were reviewed and updated as appropriate: allergies, current medications, past family history, past medical history, past social history, past surgical history and problem list.  ? ?Objective:  ? ?Vitals:  ? 02/04/22 0938  ?BP: 112/73  ?Pulse: 83  ?Weight: 151 lb 12.8 oz (68.9 kg)  ? ? ?Fetal Status: Fetal Heart Rate (bpm): 135 Fundal Height: 39 cm Movement: (!) Decreased  Presentation: Vertex ? ?General:  Alert, oriented and cooperative. Patient is in no acute distress.  ?Skin: Skin is warm and dry. No rash noted.   ?Cardiovascular: Normal heart rate noted  ?Respiratory: Normal respiratory effort, no problems with respiration noted  ?Abdomen: Soft, gravid, appropriate for gestational age.  Pain/Pressure: Present     ?Pelvic: Cervical exam deferred        ?Extremities: Normal range of motion.  Edema: None  ?Mental Status: Normal mood and affect. Normal behavior. Normal judgment and thought content.  ? ?Assessment and Plan:  ?Pregnancy: [id number] at [redacted]w[redacted]d ?1. Supervision of low-risk pregnancy, third trimester ?- Doing well overall, ready for labor to start ? ?2. [redacted] weeks gestation of pregnancy ?- Routine OB care  ? ?3. GBS bacteriuria ?- Will treat in labor ? ?4. History of cesarean section ?- Consented for TOLAC and has IOL scheduled for next week unless NST/BPP dictates earlier ? ?5. Decreased fetal  movements in third trimester, single or unspecified fetus ?- NST reactive, BPP 10/10 but baby did need stimulation to begin moving and there was an almost variable late deceleration. Discussed with Dr. Para March who agreed pt should be directly admitted to L&D for IOL. ?- Discussed with patient, reviewed IOL methods for TOLACs, pt amenable to plan. Will stop at home to eat and get her bag, then present to [facility] (MAU for registration then admission to L&D) ? ?Please refer to After Visit Summary for other counseling recommendations.  ? ?Return in about 7 weeks (around 03/25/2022) for PP. ? ?Bernerd Limbo, CNM ? ?

## 2022-02-04 NOTE — Discharge Summary (Signed)
? ?  Postpartum Discharge Summary ? ? ?   ?Patient Name: Claudia Lucas ?DOB: 1996/04/30 ?MRN: [id number] ? ?Date of admission: 02/04/2022 ?Delivery date:02/04/2022  ?Delivering provider: Vennie Homans  ?Date of discharge: 02/06/2022 ? ?Admitting diagnosis: Post term pregnancy at [redacted] weeks gestation [O48.0, Z3A.41] ?Intrauterine pregnancy: [redacted]w[redacted]d    ?Secondary diagnosis:  Principal Problem: ?  Post term pregnancy at [redacted] weeks gestation ?Active Problems: ?  History of cesarean section ?  Supervision of low-risk pregnancy ?  GBS bacteriuria ? ?Additional problems: none    ?Discharge diagnosis: Term Pregnancy Delivered and VBAC                                              ?Post partum procedures: Nexplanon placement ?Augmentation: AROM, Pitocin, and IP Foley ?Complications: None ? ?Hospital course: Induction of Labor With Vaginal Delivery   ?26y.o. yo G3P2002 at 346w6das admitted to the hospital 02/04/2022 for induction of labor.  Indication for induction:  non-reactive NST in office .  Patient had an uncomplicated labor course including unintentional AROM during cervical foley placement followed by low dose Pitocin and then progression to complete not long afterwards. ?Membrane Rupture Time/Date: 3:44 PM ,02/04/2022   ?Delivery Method:VBAC, Spontaneous  ?Episiotomy:   ?Lacerations:  1st degree -perineal; hemostatic and not repaired ?Details of delivery can be found in separate delivery note.  Patient had a routine postpartum course. Patient is discharged home 02/06/22. ? ?Newborn Data: ?Birth date:02/04/2022  ?Birth time:8:41 PM  ?Gender:Female  ?Living status:Living  ?Apgars:7 ,8  ?Weight:2950 g (6lb 8.1oz) ? ?Magnesium Sulfate received: No ?BMZ received: No ?Rhophylac:N/A ?MMR:N/A ?T-DaP:Given prenatally ?Flu: Yes ?Transfusion:No ? ?Physical exam  ?Vitals:  ? 02/05/22 0800 02/05/22 1100 02/05/22 2220 02/06/22 0456  ?BP: 113/80 107/80 118/88 98/72  ?Pulse: 61 80 71 61  ?Resp: _0 ?Temp: 98.2 ?F (36.8 ?C) 98.2 ?F  (36.8 ?C) 98.3 ?F (36.8 ?C) 98.2 ?F (36.8 ?C)  ?TempSrc: Axillary Axillary Oral Oral  ?SpO2: 99% 99% 100% 99%  ?Weight:      ?Height:      ? ?General: alert, cooperative, and no distress ?Lochia: appropriate ?Uterine Fundus: firm ?Incision: N/A ?DVT Evaluation: No evidence of DVT seen on physical exam. ?Negative Homan's sign. ?No cords or calf tenderness. ?Labs: ?Lab Results  ?Component Value Date  ? WBC 6.9 02/04/2022  ? HGB 11.0 (L) 02/04/2022  ? HCT 32.7 (L) 02/04/2022  ? MCV 92.6 02/04/2022  ? PLT 231 02/04/2022  ? ?CMP Latest Ref Rng & Units 11/20/2015  ?Glucose 65 - 99 mg/dL 74  ? ?Edinburgh Score: ?Edinburgh Postnatal Depression Scale Screening Tool 02/06/2018  ?I have been able to laugh and see the funny side of things. 3  ?I have looked forward with enjoyment to things. 0  ?I have blamed myself unnecessarily when things went wrong. 0  ?I have been anxious or worried for no good reason. 0  ?I have felt scared or panicky for no good reason. 0  ?Things have been getting on top of me. 0  ?I have been so unhappy that I have had difficulty sleeping. 0  ?I have felt sad or miserable. 0  ?I have been so unhappy that I have been crying. 0  ?The thought of harming myself has occurred to me. 0  ?Edinburgh Postnatal Depression Scale Total 3  ? ? ? ?  After visit meds:  ?Allergies as of 02/06/2022   ?No Known Allergies ?  ? ?  ?Medication List  ?  ? ?STOP taking these medications   ? ?terconazole 0.4 % vaginal cream ?Commonly known as: TERAZOL 7 ?  ? ?  ? ?TAKE these medications   ? ?acetaminophen 325 MG tablet ?Commonly known as: Tylenol ?Take 2 tablets (650 mg total) by mouth every 4 (four) hours as needed (for pain scale < 4). ?  ?ibuprofen 600 MG tablet ?Commonly known as: ADVIL ?Take 1 tablet (600 mg total) by mouth every 6 (six) hours. ?  ?PrePLUS 27-1 MG Tabs ?Take 1 each by mouth daily. ?  ? ?  ? ? ? ?Discharge home in stable condition ?Infant Feeding: Breast ?Infant Disposition:home with mother ?Discharge  instruction: per After Visit Summary and Postpartum booklet. ?Activity: Advance as tolerated. Pelvic rest for 6 weeks.  ?Diet: routine diet ?Future Appointments: ?Future Appointments  ?Date Time Provider [place]  ?03/18/2022  3:15 PM Gabriel Carina, CNM [facility]  ? ?Follow up Visit: ? ?Myrtis Ser, CNM  P [facility] ?Please schedule this patient for Postpartum visit in: 6 weeks with the following provider: Any provider  ?In-Person  ?For C/S patients schedule nurse incision check in weeks 2 weeks: no  ?High risk pregnancy complicated by: TOLAC  ?Delivery mode:  VBAC  ?Anticipated Birth Control:  PP Nexplanon placed  ?PP Procedures needed: Pap needed PP  ?Schedule Integrated BH visit: no ? ? ?02/06/2022 ?Christin Fudge, CNM ? ? ? ?

## 2022-02-05 DIAGNOSIS — Z30017 Encounter for initial prescription of implantable subdermal contraceptive: Secondary | ICD-10-CM | POA: Diagnosis not present

## 2022-02-05 LAB — RPR: RPR Ser Ql: NONREACTIVE

## 2022-02-05 MED ORDER — ETONOGESTREL 68 MG ~~LOC~~ IMPL
68.0000 mg | DRUG_IMPLANT | Freq: Once | SUBCUTANEOUS | Status: AC
  Administered 2022-02-05: 10:00:00 68 mg via SUBCUTANEOUS
  Filled 2022-02-05: qty 1

## 2022-02-05 MED ORDER — LIDOCAINE HCL 1 % IJ SOLN
0.0000 mL | Freq: Once | INTRAMUSCULAR | Status: AC | PRN
  Administered 2022-02-05: 10:00:00 20 mL via INTRADERMAL
  Filled 2022-02-05: qty 20

## 2022-02-05 NOTE — Procedures (Addendum)
Post-Placental Nexplanon Insertion Procedure Note ? ?Patient was identified. Informed consent was signed, signed copy in chart. A time-out was performed.   ? ?The insertion site was identified 8-10 cm (3-4 inches) from the medial epicondyle of the humerus and 3-5 cm (1.25-2 inches) posterior to (below) the sulcus (groove) between the biceps and triceps muscles of the patient's left arm and marked. The site was prepped and draped in the usual sterile fashion. Pt was prepped with alcohol swab and then injected with 2.5 cc of 1% lidocaine. ?The site was prepped with alcohol. Nexplanon removed form packaging,  Device confirmed in needle, then inserted full length of needle and withdrawn per handbook instructions. Provider and patient verified presence of the implant in the woman?s arm by palpation. Pt insertion site was covered with steristrips/adhesive bandage and pressure bandage. There was minimal blood loss. Patient tolerated procedure well. ? ?Patient was given post procedure instructions and Nexplanon user card with expiration date. Condoms were recommended for STI prevention. Patient was asked to keep the pressure dressing on for 24 hours to minimize bruising and keep the adhesive bandage on for 3-5 days. The patient verbalized understanding of the plan of care and agrees.  ? ?Lot # [id number] ?Expiration Date 03/08/2024  ? ?The above was performed under my direct supervision and guidance.  ? ?

## 2022-02-05 NOTE — Lactation Note (Signed)
This note was copied from a baby's chart. ?Lactation Consultation Note ? ?Patient Name: Boy Jamelah Neyens ?Today's Date: 02/05/2022 ?Reason for consult: Initial assessment;Term ?Age:28 hours ? ?LC in to visit with P3 Mom of term baby delivered by VBAC.  Mom denies any difficulty with latching baby to the breast.  Mom with baby STS on her chest and baby is sleeping.  ? ?Encouraged continued STS and offering the breast with feeding cues. ? ?Mom encouraged to ask for help prn. ? ?Maternal Data ?Has patient been taught Hand Expression?: No ?Does the patient have breastfeeding experience prior to this delivery?: Yes ?How long did the patient breastfeed?: 2 years each with 2 prior babies ? ?Feeding ?Mother's Current Feeding Choice: Breast Milk and formula ? ?Interventions ?Interventions: Breast feeding basics reviewed;Skin to skin;Hand express;Breast massage;LC Services brochure ?Consult Status ?Consult Status: Follow-up ?Date: 02/06/22 ?Follow-up type: In-patient ? ? ? ?Tilda Burrow E ?02/05/2022, 12:02 PM ? ? ? ?

## 2022-02-05 NOTE — Progress Notes (Signed)
Patient had no complaints this am. She understood what I was explaining to her. ?

## 2022-02-05 NOTE — Progress Notes (Signed)
Post Partum Day 1 ?Subjective: ?Doing well. No acute events overnight. Pain is controlled and bleeding is appropriate. She is eating, drinking, voiding, and ambulating without issue. She is breast and bottle feeding which is going well. She has no other concerns at this time. ? ?Objective: ?Blood pressure 132/69, pulse 71, temperature 97.6 ?F (36.4 ?C), temperature source Oral, resp. rate 16, height 5\' 3"  (1.6 m), weight 69.1 kg, last menstrual period 05/01/2021, SpO2 100 %, currently breastfeeding. ? ?Physical Exam:  ?General: alert, cooperative, and no distress ?Lochia: appropriate ?Uterine Fundus: firm ?DVT Evaluation: no LE edema, no calf tenderness to palpation ? ?Recent Labs  ?  02/04/22 ?1430  ?HGB 11.0*  ?HCT 32.7*  ? ? ?Assessment/Plan: ?Claudia Lucas is a 26 y.o. G3P2002 on PPD# 1 s/p VBAC. ? ?Progressing well. Meeting postpartum milestones. VSS. Continue routine postpartum care. ? ?Feeding: Both ?Contraception: Nexplanon, will place prior to discharge ?Circumcision: Yes inpatient, consented at bedside ? ?Dispo: Plan for discharge PPD#2. ? ? LOS: 1 day  ? ?34, DO PGY-1 ?02/05/2022, 7:08 AM  ? ? ?

## 2022-02-06 ENCOUNTER — Encounter (HOSPITAL_COMMUNITY): Payer: Self-pay | Admitting: Family Medicine

## 2022-02-06 ENCOUNTER — Other Ambulatory Visit (HOSPITAL_COMMUNITY): Payer: Self-pay

## 2022-02-06 MED ORDER — IBUPROFEN 600 MG PO TABS
600.0000 mg | ORAL_TABLET | Freq: Four times a day (QID) | ORAL | 0 refills | Status: AC
  Filled 2022-02-06: qty 30, 8d supply, fill #0

## 2022-02-06 MED ORDER — ACETAMINOPHEN 325 MG PO TABS: 650.0000 mg | ORAL_TABLET | ORAL | Status: AC | PRN

## 2022-02-12 ENCOUNTER — Inpatient Hospital Stay (HOSPITAL_COMMUNITY): Payer: Medicaid Other

## 2022-02-14 ENCOUNTER — Telehealth (HOSPITAL_COMMUNITY): Payer: Self-pay

## 2022-02-14 NOTE — Telephone Encounter (Signed)
?  No answer. Left message to return nurse call. ? Heron Nay ?02/14/2022,1425 ?

## 2022-03-18 ENCOUNTER — Ambulatory Visit: Payer: Medicaid Other | Admitting: Certified Nurse Midwife

## 2022-04-15 ENCOUNTER — Ambulatory Visit: Payer: Medicaid Other | Admitting: Obstetrics and Gynecology

## 2024-02-01 ENCOUNTER — Ambulatory Visit: Payer: Medicaid Other | Admitting: Obstetrics and Gynecology
